# Patient Record
Sex: Female | Born: 1970 | Race: Black or African American | Hispanic: No | Marital: Married | Smoking: Never smoker
Health system: Southern US, Community
[De-identification: ages and names within clinical notes are randomized; demographics above are authoritative.]

## PROBLEM LIST (undated history)

## (undated) DIAGNOSIS — I10 Essential (primary) hypertension: Secondary | ICD-10-CM

## (undated) DIAGNOSIS — I639 Cerebral infarction, unspecified: Secondary | ICD-10-CM

## (undated) HISTORY — PX: TUBAL LIGATION: SHX77

## (undated) HISTORY — DX: Cerebral infarction, unspecified: I63.9

---

## 2014-08-08 ENCOUNTER — Emergency Department: Payer: Self-pay | Admitting: Emergency Medicine

## 2015-09-27 ENCOUNTER — Emergency Department: Payer: BC Managed Care – PPO

## 2015-09-27 ENCOUNTER — Inpatient Hospital Stay
Admission: EM | Admit: 2015-09-27 | Discharge: 2015-09-29 | DRG: 065 | Disposition: A | Discharge status: Home / Self Care | Payer: BC Managed Care – PPO | Attending: Internal Medicine | Admitting: Internal Medicine

## 2015-09-27 ENCOUNTER — Encounter: Payer: Self-pay | Admitting: *Deleted

## 2015-09-27 ENCOUNTER — Inpatient Hospital Stay: Payer: BC Managed Care – PPO

## 2015-09-27 DIAGNOSIS — D696 Thrombocytopenia, unspecified: Secondary | ICD-10-CM | POA: Diagnosis present

## 2015-09-27 DIAGNOSIS — I10 Essential (primary) hypertension: Secondary | ICD-10-CM | POA: Diagnosis present

## 2015-09-27 DIAGNOSIS — I639 Cerebral infarction, unspecified: Secondary | ICD-10-CM | POA: Diagnosis not present

## 2015-09-27 DIAGNOSIS — I160 Hypertensive urgency: Secondary | ICD-10-CM | POA: Diagnosis present

## 2015-09-27 DIAGNOSIS — G8194 Hemiplegia, unspecified affecting left nondominant side: Secondary | ICD-10-CM | POA: Diagnosis present

## 2015-09-27 DIAGNOSIS — Z8249 Family history of ischemic heart disease and other diseases of the circulatory system: Secondary | ICD-10-CM

## 2015-09-27 DIAGNOSIS — G459 Transient cerebral ischemic attack, unspecified: Secondary | ICD-10-CM | POA: Diagnosis not present

## 2015-09-27 DIAGNOSIS — E876 Hypokalemia: Secondary | ICD-10-CM | POA: Diagnosis present

## 2015-09-27 DIAGNOSIS — Z8673 Personal history of transient ischemic attack (TIA), and cerebral infarction without residual deficits: Secondary | ICD-10-CM

## 2015-09-27 DIAGNOSIS — E785 Hyperlipidemia, unspecified: Secondary | ICD-10-CM | POA: Diagnosis present

## 2015-09-27 DIAGNOSIS — Z9119 Patient's noncompliance with other medical treatment and regimen: Secondary | ICD-10-CM

## 2015-09-27 DIAGNOSIS — Z7902 Long term (current) use of antithrombotics/antiplatelets: Secondary | ICD-10-CM | POA: Diagnosis not present

## 2015-09-27 DIAGNOSIS — Z821 Family history of blindness and visual loss: Secondary | ICD-10-CM

## 2015-09-27 DIAGNOSIS — D649 Anemia, unspecified: Secondary | ICD-10-CM | POA: Diagnosis present

## 2015-09-27 DIAGNOSIS — Z823 Family history of stroke: Secondary | ICD-10-CM

## 2015-09-27 DIAGNOSIS — Z7982 Long term (current) use of aspirin: Secondary | ICD-10-CM | POA: Diagnosis not present

## 2015-09-27 DIAGNOSIS — Z818 Family history of other mental and behavioral disorders: Secondary | ICD-10-CM | POA: Diagnosis not present

## 2015-09-27 DIAGNOSIS — G451 Carotid artery syndrome (hemispheric): Secondary | ICD-10-CM

## 2015-09-27 DIAGNOSIS — I6350 Cerebral infarction due to unspecified occlusion or stenosis of unspecified cerebral artery: Secondary | ICD-10-CM

## 2015-09-27 HISTORY — DX: Essential (primary) hypertension: I10

## 2015-09-27 LAB — COMPREHENSIVE METABOLIC PANEL
ALBUMIN: 4 g/dL (ref 3.5–5.0)
ALT: 16 U/L (ref 14–54)
AST: 21 U/L (ref 15–41)
Alkaline Phosphatase: 64 U/L (ref 38–126)
Anion gap: 5 (ref 5–15)
BUN: 20 mg/dL (ref 6–20)
CHLORIDE: 106 mmol/L (ref 101–111)
CO2: 28 mmol/L (ref 22–32)
Calcium: 8.9 mg/dL (ref 8.9–10.3)
Creatinine, Ser: 0.89 mg/dL (ref 0.44–1.00)
GFR calc non Af Amer: >60 mL/min (ref >60)
Glucose, Bld: 101 mg/dL — ABNORMAL HIGH (ref 65–99)
Potassium: 3.4 mmol/L — ABNORMAL LOW (ref 3.5–5.1)
SODIUM: 139 mmol/L (ref 135–145)
Total Bilirubin: 0.5 mg/dL (ref 0.3–1.2)
Total Protein: 7.7 g/dL (ref 6.5–8.1)

## 2015-09-27 LAB — LIPID PANEL
CHOL/HDL RATIO: 3.9 ratio
CHOLESTEROL: 196 mg/dL (ref 0–200)
HDL: 50 mg/dL (ref >40)
LDL Cholesterol: 134 mg/dL — ABNORMAL HIGH (ref 0–99)
TRIGLYCERIDES: 60 mg/dL (ref <150)
VLDL: 12 mg/dL (ref 0–40)

## 2015-09-27 LAB — CBC WITH DIFFERENTIAL/PLATELET
BASOS PCT: 1 %
Basophils Absolute: 0 10*3/uL (ref 0–0.1)
EOS ABS: 0.1 10*3/uL (ref 0–0.7)
EOS PCT: 2 %
HCT: 32.9 % — ABNORMAL LOW (ref 35.0–47.0)
Hemoglobin: 10.7 g/dL — ABNORMAL LOW (ref 12.0–16.0)
Lymphocytes Relative: 35 %
Lymphs Abs: 1.6 10*3/uL (ref 1.0–3.6)
MCH: 26.9 pg (ref 26.0–34.0)
MCHC: 32.5 g/dL (ref 32.0–36.0)
MCV: 82.7 fL (ref 80.0–100.0)
Monocytes Absolute: 0.3 10*3/uL (ref 0.2–0.9)
Monocytes Relative: 7 %
Neutro Abs: 2.6 10*3/uL (ref 1.4–6.5)
Neutrophils Relative %: 55 %
Platelets: 145 10*3/uL — ABNORMAL LOW (ref 150–440)
RBC: 3.98 MIL/uL (ref 3.80–5.20)
RDW: 13.7 % (ref 11.5–14.5)
WBC: 4.7 10*3/uL (ref 3.6–11.0)

## 2015-09-27 LAB — MRSA PCR SCREENING: MRSA BY PCR: NEGATIVE

## 2015-09-27 LAB — TROPONIN I: Troponin I: <0.03 ng/mL (ref <0.031)

## 2015-09-27 LAB — GLUCOSE, CAPILLARY: Glucose-Capillary: 96 mg/dL (ref 65–99)

## 2015-09-27 MED ORDER — PANTOPRAZOLE SODIUM 40 MG PO TBEC
40.0000 mg | DELAYED_RELEASE_TABLET | Freq: Every day | ORAL | Status: DC
  Administered 2015-09-28 – 2015-09-29 (×2): 40 mg via ORAL
  Filled 2015-09-27 (×2): qty 1

## 2015-09-27 MED ORDER — ASPIRIN 325 MG PO TABS
325.0000 mg | ORAL_TABLET | Freq: Every day | ORAL | Status: DC
Start: 2015-09-27 — End: 2015-09-28
  Administered 2015-09-27 – 2015-09-28 (×2): 325 mg via ORAL
  Filled 2015-09-27 (×2): qty 1

## 2015-09-27 MED ORDER — HYDRALAZINE HCL 20 MG/ML IJ SOLN
10.0000 mg | INTRAMUSCULAR | Status: DC | PRN
  Administered 2015-09-27 – 2015-09-29 (×4): 10 mg via INTRAVENOUS
  Filled 2015-09-27 (×4): qty 1

## 2015-09-27 MED ORDER — PANTOPRAZOLE SODIUM 40 MG IV SOLR
40.0000 mg | INTRAVENOUS | Status: DC
  Administered 2015-09-27: 40 mg via INTRAVENOUS
  Filled 2015-09-27: qty 40

## 2015-09-27 MED ORDER — CLONIDINE HCL 0.1 MG PO TABS
0.2000 mg | ORAL_TABLET | Freq: Once | ORAL | Status: AC
  Administered 2015-09-27: 0.2 mg via ORAL
  Filled 2015-09-27: qty 2

## 2015-09-27 MED ORDER — HYDRALAZINE HCL 20 MG/ML IJ SOLN: 10.0000 mg | Freq: Once | INTRAMUSCULAR | Status: DC

## 2015-09-27 MED ORDER — ATORVASTATIN CALCIUM 20 MG PO TABS
20.0000 mg | ORAL_TABLET | Freq: Every day | ORAL | Status: DC
  Administered 2015-09-27: 20 mg via ORAL
  Filled 2015-09-27: qty 1

## 2015-09-27 MED ORDER — AMLODIPINE BESYLATE 10 MG PO TABS
10.0000 mg | ORAL_TABLET | Freq: Every day | ORAL | Status: DC
  Administered 2015-09-27 – 2015-09-29 (×3): 10 mg via ORAL
  Filled 2015-09-27 (×3): qty 1

## 2015-09-27 MED ORDER — METOPROLOL TARTRATE 25 MG PO TABS
25.0000 mg | ORAL_TABLET | Freq: Two times a day (BID) | ORAL | Status: DC
  Administered 2015-09-27 (×3): 25 mg via ORAL
  Filled 2015-09-27 (×3): qty 1

## 2015-09-27 MED ORDER — ASPIRIN 81 MG PO CHEW
324.0000 mg | CHEWABLE_TABLET | Freq: Once | ORAL | Status: AC
  Administered 2015-09-27: 324 mg via ORAL
  Filled 2015-09-27: qty 4

## 2015-09-27 MED ORDER — ACETAMINOPHEN 325 MG PO TABS
650.0000 mg | ORAL_TABLET | ORAL | Status: DC | PRN
  Administered 2015-09-27 (×2): 650 mg via ORAL
  Filled 2015-09-27: qty 2

## 2015-09-27 MED ORDER — CLONIDINE HCL 0.1 MG PO TABS
0.1000 mg | ORAL_TABLET | Freq: Three times a day (TID) | ORAL | Status: DC
  Administered 2015-09-27 (×3): 0.1 mg via ORAL
  Filled 2015-09-27 (×3): qty 1

## 2015-09-27 MED ORDER — STROKE: EARLY STAGES OF RECOVERY BOOK
Freq: Once | Status: AC
  Administered 2015-09-27: 10:00:00

## 2015-09-27 MED ORDER — ACETAMINOPHEN 650 MG RE SUPP: 650.0000 mg | RECTAL | Status: DC | PRN

## 2015-09-27 MED ORDER — HYDRALAZINE HCL 20 MG/ML IJ SOLN
10.0000 mg | Freq: Once | INTRAMUSCULAR | Status: AC
  Administered 2015-09-27: 10 mg via INTRAVENOUS
  Filled 2015-09-27: qty 1

## 2015-09-27 MED ORDER — ENOXAPARIN SODIUM 40 MG/0.4ML ~~LOC~~ SOLN
40.0000 mg | SUBCUTANEOUS | Status: DC
  Administered 2015-09-27 – 2015-09-28 (×2): 40 mg via SUBCUTANEOUS
  Filled 2015-09-27 (×3): qty 0.4

## 2015-09-27 MED ORDER — ASPIRIN 300 MG RE SUPP: 300.0000 mg | Freq: Every day | RECTAL | Status: DC

## 2015-09-27 MED ORDER — POTASSIUM CHLORIDE CRYS ER 20 MEQ PO TBCR
20.0000 meq | EXTENDED_RELEASE_TABLET | Freq: Once | ORAL | Status: AC
Start: 2015-09-27 — End: 2015-09-27
  Administered 2015-09-27: 20 meq via ORAL
  Filled 2015-09-27: qty 1

## 2015-09-27 NOTE — Progress Notes (Signed)
Report given to [facility] on 1C. Patient to move to room 108. Patient and family notified of move.

## 2015-09-27 NOTE — Progress Notes (Signed)
PT Hold Note  Patient Details Name: Delberta Folts MRN: [id number] DOB: Apr 10, 1971   Cancelled Treatment:     Chart reviewed and RN consulted. PT order received is for "start tomorrow" 09/28/15. Asked RN to clarify PT order with MD to see if he would like PT consult today. Will continue to monitor and if order is modified for today will attempt evaluation when appropriate.   Sharalyn Ink Huprich PT, DPT   Huprich,Jason 09/27/2015, 9:04 AM

## 2015-09-27 NOTE — ED Notes (Signed)
Notified nurse I am leaving with her pt now.

## 2015-09-27 NOTE — H&P (Signed)
[facility] [place] at [facility]   PATIENT NAME: Stephanie Hawkins    MR#:  [id number]  DATE OF BIRTH:  [birth year]  DATE OF ADMISSION:  09/27/2015  PRIMARY CARE PHYSICIAN: No primary care provider on file.   REQUESTING/REFERRING PHYSICIAN:   CHIEF COMPLAINT:   Chief Complaint  Patient presents with  . Extremity Weakness    HISTORY OF PRESENT ILLNESS: Ayleen Mckinstry  is a 45 y.o. female with a known history of hypertension presented to the emergency room with left lower extremity weakness since yesterday morning. Patient woke up yesterday morning was trying to go to the bathroom to brush her teeth. She felt weak in her left leg and also noticed some weakness in the left upper extremity. Patient says her left leg was more weaker than the left upper extremity when she first noticed it. No history of any slurred speech. No complaints of any tingling or numbness in any part of the body. Prior history of any stroke. Patient not taking blood pressure medication for the last 1 year. Does not follow up with any primary care physician for the last few years. When patient presented to the emergency room her blood pressure was high and systolic blood pressure was more than 210 mm of Hg and diastolic pressure was around 160 mm of Hg. Patient was given IV hydralazine in the emergency room for control of blood pressure. Initial CT scan of the head did not show any intracranial abnormality. No history of any headache dizziness or blurry vision. No history of any fall or any seizures.  PAST MEDICAL HISTORY:   Past Medical History  Diagnosis Date  . Hypertension     PAST SURGICAL HISTORY: Past Surgical History  Procedure Laterality Date  . Tubal ligation      SOCIAL HISTORY:  Social History  Substance Use Topics  . Smoking status: Never Smoker   . Smokeless tobacco: Not on file  . Alcohol Use: No    FAMILY HISTORY:  Family History  Problem Relation Age of Onset  . Heart  failure Mother   . CVA Father     DRUG ALLERGIES: No Known Allergies  REVIEW OF SYSTEMS:   CONSTITUTIONAL: No fever, fatigue or weakness.  EYES: No blurred or double vision.  EARS, NOSE, AND THROAT: No tinnitus or ear pain.  RESPIRATORY: No cough, shortness of breath, wheezing or hemoptysis.  CARDIOVASCULAR: No chest pain, orthopnea, edema.  GASTROINTESTINAL: No nausea, vomiting, diarrhea or abdominal pain.  GENITOURINARY: No dysuria, hematuria.  ENDOCRINE: No polyuria, nocturia,  HEMATOLOGY: No anemia, easy bruising or bleeding SKIN: No rash or lesion. MUSCULOSKELETAL: No joint pain or arthritis.   NEUROLOGIC: No tingling, numbness, weakness in the left lower extremity noted  PSYCHIATRY: No anxiety or depression.   MEDICATIONS AT HOME:  Prior to Admission medications   Not on File      PHYSICAL EXAMINATION:   VITAL SIGNS: Blood pressure 211/161, pulse 85, temperature 98.7 F (37.1 C), temperature source Oral, resp. rate 18, height  (1.651 m), weight 90.719 kg (200 lb), last menstrual period 09/05/2015, SpO2 97 %.  GENERAL:  45 y.o.-year-old patient lying in the bed with no acute distress.  EYES: Pupils equal, round, reactive to light and accommodation. No scleral icterus. Extraocular muscles intact.  HEENT: Head atraumatic, normocephalic. Oropharynx and nasopharynx clear.  NECK:  Supple, no jugular venous distention. No thyroid enlargement, no tenderness.  LUNGS: Normal breath sounds bilaterally, no wheezing, rales,rhonchi or crepitation. No use of  accessory muscles of respiration.  CARDIOVASCULAR: S1, S2 normal. No murmurs, rubs, or gallops.  ABDOMEN: Soft, nontender, nondistended. Bowel sounds present. No organomegaly or mass.  EXTREMITIES: No pedal edema, cyanosis, or clubbing.  NEUROLOGIC: Cranial nerves II through XII are intact. Muscle strength 5/5 in right upper and right lower extremity and left upper extremity..Power 4/5 left lower extremity. Sensation intact.  No cerebellar signs noted.  PSYCHIATRIC: The patient is alert and oriented x 3.  SKIN: No obvious rash, lesion, or ulcer.   LABORATORY PANEL:   CBC  Recent Labs Lab 09/27/15 0058  WBC 4.7  HGB 10.7*  HCT 32.9*  PLT 145*  MCV 82.7  MCH 26.9  MCHC 32.5  RDW 13.7  LYMPHSABS 1.6  MONOABS 0.3  EOSABS 0.1  BASOSABS 0.0   ------------------------------------------------------------------------------------------------------------------  Chemistries   Recent Labs Lab 09/27/15 0058  NA 139  K 3.4*  CL 106  CO2 28  GLUCOSE 101*  BUN 20  CREATININE 0.89  CALCIUM 8.9  AST 21  ALT 16  ALKPHOS 64  BILITOT 0.5   ------------------------------------------------------------------------------------------------------------------ estimated creatinine clearance is 89.8 mL/min (by C-G formula based on Cr of 0.89). ------------------------------------------------------------------------------------------------------------------ No results for input(s): TSH, T4TOTAL, T3FREE, THYROIDAB in the last 72 hours.  Invalid input(s): FREET3   Coagulation profile No results for input(s): INR, PROTIME in the last 168 hours. ------------------------------------------------------------------------------------------------------------------- No results for input(s): DDIMER in the last 72 hours. -------------------------------------------------------------------------------------------------------------------  Cardiac Enzymes  Recent Labs Lab 09/27/15 0058  TROPONINI <0.03   ------------------------------------------------------------------------------------------------------------------ Invalid input(s): POCBNP  ---------------------------------------------------------------------------------------------------------------  Urinalysis No results found for: COLORURINE, APPEARANCEUR, LABSPEC, PHURINE, GLUCOSEU, HGBUR, BILIRUBINUR, KETONESUR, PROTEINUR, UROBILINOGEN, NITRITE,  LEUKOCYTESUR   RADIOLOGY: Ct Head Wo Contrast  09/27/2015  CLINICAL DATA:  Heaviness in the left lower leg since the morning of 09/26/2015. No known injury. EXAM: CT HEAD WITHOUT CONTRAST TECHNIQUE: Contiguous axial images were obtained from the base of the skull through the vertex without intravenous contrast. COMPARISON:  None. FINDINGS: Examination is limited due to streak artifact. Ventricles and sulci appear symmetrical. No ventricular dilatation. There patchy low-attenuation changes in the deep white matter. These are nonspecific and could represent small vessel ischemic changes or other demyelinating process. No abnormal extra-axial fluid collections. Gray-white matter junctions are distinct. Basal cisterns are not effaced. No acute intracranial hemorrhage. Opacification of scattered ethmoid air cells. Paranasal sinuses and mastoid air cells are otherwise not opacified. Calvarium appears intact. IMPRESSION: Nonspecific changes in the white matter. No acute intracranial hemorrhage or mass effect. Electronically Signed   By: Burman Nieves M.D.   On: 09/27/2015 01:40   Dg Chest Port 1 View  09/27/2015  CLINICAL DATA:  Heaviness in the left lower leg. History of hypertension. EXAM: PORTABLE CHEST 1 VIEW COMPARISON:  None. FINDINGS: Slightly shallow inspiration. Cardiac enlargement without vascular congestion. No focal airspace disease or consolidation in the lungs. No blunting of costophrenic angles. No pneumothorax. Mediastinal contours appear intact. IMPRESSION: Mild cardiac enlargement.  No evidence of active pulmonary disease. Electronically Signed   By: Burman Nieves M.D.   On: 09/27/2015 01:24    EKG: Orders placed or performed during the hospital encounter of 09/27/15  . ED EKG  . ED EKG  . EKG 12-Lead  . EKG 12-Lead    IMPRESSION AND PLAN: 45 year old female patient with history of high blood pressure noncompliant with hypertensive medication presented to the emergency room with  elevated blood pressure and left lower extremity weakness.  Admitting diagnosis 1. Hypertensive urgency 2. Noncompliance with hypertension  medication 3. Transient ischemic attack 4. Mild hypokalemia  Treatment plan Mid patient to stepdown unit Control blood pressure with oral metoprolol and IV hydralazine as needed Her blood pressure does not get controlled will start patient on IV nicardipine drip MRI brain to rule out CVA, carotid ultrasound and echocardiogram Neurology consultation Replace potassium.  All the records are reviewed and case discussed with ED provider. Management plans discussed with the patient, family and they are in agreement.  CODE STATUS:FULL Code Status History    This patient does not have a recorded code status. Please follow your organizational policy for patients in this situation.       TOTAL TIME TAKING CARE OF THIS PATIENT: 54 minutes.    Ihor Austin M.D on 09/27/2015 at 2:22 AM  Between 7am to 6pm - Pager - [phone]  After 6pm go to [email or web address] - password [email or web address] [facility]  [place] [facility]  Office  [phone]  CC: Primary care physician; No primary care provider on file.

## 2015-09-27 NOTE — Progress Notes (Signed)
Spoke with Dr. Elisabeth Pigeon about patient's status, MRI results, and blood pressure. MD ordered patient 10 mg of norvasc once a day to start this afternoon and ordered patient to move out to the any med-surg floor without telemetry. No new orders based on MRI results.

## 2015-09-27 NOTE — ED Notes (Signed)
Patient transported to CT 

## 2015-09-27 NOTE — Progress Notes (Signed)
eLink Physician-Brief Progress Note Patient Name: Stephanie Hawkins DOB: May 29, 1971 MRN: [id number]   Date of Service  09/27/2015  HPI/Events of Note  45 yo female with PMH of HTN not compliant with medications X 1 year. Presents with BP = 210/160 and LE weakness. Head CT Scan - negative. Medical regimen includes Clonidine X 1, Hydralazine IV PRN, Lovenox [place], ASA and Lopressor. BP = 148/94, HR = 78, Sat = 99% and RR = 16. Management per Hospitalist Service.   eICU Interventions  Will order Protonix IV, otherwise continue present management.      Intervention Category Evaluation Type: New Patient Evaluation  Lenell Antu 09/27/2015, 6:07 AM

## 2015-09-27 NOTE — ED Notes (Signed)
Failed second IV site start x2, asking another nurse to try.

## 2015-09-27 NOTE — ED Provider Notes (Signed)
[facility] Emergency Department Provider Note  ____________________________________________  Time seen: Approximately 1:39 AM  I have reviewed the triage vital signs and the nursing notes.   HISTORY  Chief Complaint Extremity Weakness    HPI Stephanie Hawkins is a 45 y.o. female who presents to the ED from home with a chief complaint of left leg weakness. Symptoms began yesterday approximately 7 AM while patient was showering. She noted mild weakness to her left leg. She was able to go to work and get through the day. When she awoke prior to her arrival to use the restroom, she almost fell secondary to leg weakness. Denies headache, vision changes, slurred speech, similar symptoms previously. Denies recent travel or trauma. She does admit to not taking antihypertensives 1 year. Denies associated symptoms of neck pain, chest pain, shortness of breath, abdominal pain, nausea, vomiting, diarrhea. Nothing makes her symptoms better or worse.   Past Medical History  Diagnosis Date  . Hypertension     Patient Active Problem List   Diagnosis Date Noted  . Hypertensive urgency 09/27/2015  . TIA (transient ischemic attack) 09/27/2015    Past Surgical History  Procedure Laterality Date  . Tubal ligation      No current outpatient prescriptions on file.  Allergies Review of patient's allergies indicates no known allergies.  Family History  Problem Relation Age of Onset  . Heart failure Mother   . CVA Father     Social History Social History  Substance Use Topics  . Smoking status: Never Smoker   . Smokeless tobacco: Not on file  . Alcohol Use: No    Review of Systems Constitutional: No fever/chills Eyes: No visual changes. ENT: No sore throat. Cardiovascular: Denies chest pain. Respiratory: Denies shortness of breath. Gastrointestinal: No abdominal pain.  No nausea, no vomiting.  No diarrhea.  No constipation. Genitourinary: Negative for  dysuria. Musculoskeletal: Negative for back pain. Skin: Negative for rash. Neurological: Negative for headaches or numbness. Positive for left lower leg weakness.  10-point ROS otherwise negative.  ____________________________________________   PHYSICAL EXAM:  VITAL SIGNS: ED Triage Vitals  Enc Vitals Group     BP 09/27/15 0032 239/139 mmHg     Pulse Rate 09/27/15 0028 98     Resp 09/27/15 0028 20     Temp 09/27/15 0028 98.7 F (37.1 C)     Temp Source 09/27/15 0028 Oral     SpO2 09/27/15 0028 98 %     Weight 09/27/15 0028 200 lb (90.719 kg)     Height 09/27/15 0028  (1.651 m)     Head Cir --      Peak Flow --      Pain Score --      Pain Loc --      Pain Edu? --      Excl. in GC? --     Constitutional: Alert and oriented. Well appearing and in no acute distress. Eyes: Conjunctivae are normal. PERRL. EOMI. Head: Atraumatic. Nose: No congestion/rhinnorhea. Mouth/Throat: Mucous membranes are moist.  Oropharynx non-erythematous. Neck: No stridor.   Cardiovascular: Normal rate, regular rhythm. Grossly normal heart sounds.  Good peripheral circulation. Respiratory: Normal respiratory effort.  No retractions. Lungs CTAB. Gastrointestinal: Soft and nontender. No distention. No abdominal bruits. No CVA tenderness. Musculoskeletal: No lower extremity tenderness nor edema.  No joint effusions. Neurologic:  Normal speech and language. CN II-XII grossly intact. 5/5 motor strength and sensation are lateral upper extremities. 4/5 motor strength left lower extremity compared  to right. Skin:  Skin is warm, dry and intact. No rash noted. Psychiatric: Mood and affect are normal. Speech and behavior are normal.  ____________________________________________   LABS (all labs ordered are listed, but only abnormal results are displayed)  Labs Reviewed  CBC WITH DIFFERENTIAL/PLATELET - Abnormal; Notable for the following:    Hemoglobin 10.7 (*)    HCT 32.9 (*)    Platelets 145 (*)     All other components within normal limits  COMPREHENSIVE METABOLIC PANEL - Abnormal; Notable for the following:    Potassium 3.4 (*)    Glucose, Bld 101 (*)    All other components within normal limits  TROPONIN I   ____________________________________________  EKG  ED ECG REPORT I, SUNG,JADE J, the attending physician, personally viewed and interpreted this ECG.   Date: 09/27/2015  EKG Time: 0048  Rate: 82  Rhythm: normal EKG, normal sinus rhythm  Axis: Normal  Intervals:none  ST&T Change: T-wave inversion lateral leads  ____________________________________________  RADIOLOGY  CT head interpreted per Dr. Andria Meuse: Nonspecific changes in the white matter. No acute intracranial hemorrhage or mass effect.  Portable chest x-ray (viewed by me, interpreted per Dr. Andria Meuse): Nonspecific changes in the white matter. No acute intracranial hemorrhage or mass effect. ____________________________________________   PROCEDURES  Procedure(s) performed: None  Critical Care performed: No  ____________________________________________   INITIAL IMPRESSION / ASSESSMENT AND PLAN / ED COURSE  Pertinent labs & imaging results that were available during my care of the patient were reviewed by me and considered in my medical decision making (see chart for details).  45 year old female who presents with focal left lower extremity weakness associated with elevated blood pressure. Code stroke was not initiated secondary to prolonged length of patient's symptoms. Laboratory results and imaging studies unremarkable. Patient was given aspirin and IV hydralazine to gently lower her blood pressure. Case was discussed with hospitalist team for admission. ____________________________________________   FINAL CLINICAL IMPRESSION(S) / ED DIAGNOSES  Final diagnoses:  TIA (transient ischemic attack)  Hypertensive urgency  Cerebral infarction due to unspecified mechanism      Irean Hong,  MD 09/27/15 [phone]

## 2015-09-27 NOTE — Progress Notes (Signed)
Speech Therapy Note: reviewed chart notes; consulted NSG and met w/ pt/family. Pt denied any speech-language deficits; she conversed on the phone and w/ SLP/family appropriately w/ no apparent language deficits. Pt also denied any swallowing deficits; noted she had eaten some of her burger and drank a drink during our conversation w/out deficits. Pt appears at her baseline w/ regard to ST services. ST will sign off at this time w/ NSG to reconsult if nec. Pt agreed. MD updated.

## 2015-09-27 NOTE — Progress Notes (Signed)
PT Cancellation Note  Patient Details Name: Stephanie Hawkins MRN: [id number] DOB: September 07, 1970   Cancelled Treatment:    Reason Eval/Treat Not Completed: Medical issues which prohibited therapy. Chart reviewed and RN consulted. BP is elevated 215/123 at last recorded reading. Pt is contraindicated for PT currently. Will attempt evaluation on later date as pt is medically appropraite.  Stephanie Hawkins PT, DPT   Jaryah Aracena 09/27/2015, 4:23 PM

## 2015-09-27 NOTE — ED Notes (Signed)
Pt returned from CT °

## 2015-09-27 NOTE — Progress Notes (Signed)
PT Cancellation Note  Patient Details Name: Stephanie Hawkins MRN: [id number] DOB: 1971/03/19   Cancelled Treatment:    Reason Eval/Treat Not Completed: Patient at procedure or test/unavailable. New order received to initiate PT evaluation today. Attempted to evaluate pt but she is out of room for imaging at this time. Will attempt at later time/date as pt is available. RN notified.  Sharalyn Ink Hillis Mcphatter PT, DPT   Vila Dory 09/27/2015, 1:48 PM

## 2015-09-27 NOTE — ED Notes (Cosign Needed)
XR at bedside

## 2015-09-27 NOTE — Progress Notes (Signed)
RN heard loud crying from patient's room. Went to check on patient and found Consulting civil engineer and patient's significant other comforting patient. Charge RN stated that patient was just informed that her mother has passed away. Family declined a visit from the [facility]. Sports administrator provided emotional support.

## 2015-09-27 NOTE — ED Notes (Addendum)
pt ambulatory to triage.  Pt reports heaviness in left lower leg.  Sx began yesterday.  No known injury.  No swelling noted.  Blood pressure elevated.  No meds for htn for 1 year.  No headache.  Pt alert.  Speech clear.

## 2015-09-27 NOTE — Progress Notes (Signed)
RN spoke with MD, Dr. Elisabeth Pigeon, on the phone about patient's blood pressure even after prn hydralazine dose, NPO status, general status, and plan for several procedures with morning. MD to adjust blood pressure medications, to put in diet order, and gave order for patient to be able to go to procedures without RN. MD stated he would be up to assess patient in person shortly.

## 2015-09-27 NOTE — Plan of Care (Signed)
Problem: Coping: Goal: Ability to identify strategies to decrease anxiety will improve Outcome: Progressing Patient with family at bedside providing support.  Problem: Tissue Perfusion: Goal: Cerebral tissue perfusion will improve Outcome: Progressing Patient with WDL neurological status but blood pressure still elevated. RN gave prn medications and RN and MD collaborated several times during shift to adjust blood pressure medications.

## 2015-09-27 NOTE — Progress Notes (Signed)
[facility] [place] [facility]   PATIENT NAME: Nykole Matos    MR#:  [id number]  DATE OF BIRTH:  1971-06-15  SUBJECTIVE:  CHIEF COMPLAINT:   Chief Complaint  Patient presents with  . Extremity Weakness    Had BP severely high, found to have stroke today.   Bp again went up to 200.  Pt have no new complains.  REVIEW OF SYSTEMS:  CONSTITUTIONAL: No fever, fatigue or weakness.  EYES: No blurred or double vision.  EARS, NOSE, AND THROAT: No tinnitus or ear pain.  RESPIRATORY: No cough, shortness of breath, wheezing or hemoptysis.  CARDIOVASCULAR: No chest pain, orthopnea, edema.  GASTROINTESTINAL: No nausea, vomiting, diarrhea or abdominal pain.  GENITOURINARY: No dysuria, hematuria.  ENDOCRINE: No polyuria, nocturia,  HEMATOLOGY: No anemia, easy bruising or bleeding SKIN: No rash or lesion. MUSCULOSKELETAL: No joint pain or arthritis.   NEUROLOGIC: No tingling, numbness,left leg weakness.  PSYCHIATRY: No anxiety or depression.   ROS  DRUG ALLERGIES:  No Known Allergies  VITALS:  Blood pressure 148/87, pulse 78, temperature 97.5 F (36.4 C), temperature source Oral, resp. rate 20, height  (1.676 m), weight 93.7 kg (206 lb 9.1 oz), last menstrual period 09/05/2015, SpO2 100 %.  PHYSICAL EXAMINATION:  GENERAL:  45 y.o.-year-old patient lying in the bed with no acute distress.  EYES: Pupils equal, round, reactive to light and accommodation. No scleral icterus. Extraocular muscles intact.  HEENT: Head atraumatic, normocephalic. Oropharynx and nasopharynx clear.  NECK:  Supple, no jugular venous distention. No thyroid enlargement, no tenderness.  LUNGS: Normal breath sounds bilaterally, no wheezing, rales,rhonchi or crepitation. No use of accessory muscles of respiration.  CARDIOVASCULAR: S1, S2 normal. No murmurs, rubs, or gallops.  ABDOMEN: Soft, nontender, nondistended. Bowel sounds present. No organomegaly or mass.  EXTREMITIES: No pedal  edema, cyanosis, or clubbing.  NEUROLOGIC: Cranial nerves II through XII are intact. Muscle strength 5/5 in all extremities except in LL on feet 4/5.Marland Kitchen Sensation intact. Gait not checked.  PSYCHIATRIC: The patient is alert and oriented x 3.  SKIN: No obvious rash, lesion, or ulcer.   Physical Exam LABORATORY PANEL:   CBC  Recent Labs Lab 09/27/15 0058  WBC 4.7  HGB 10.7*  HCT 32.9*  PLT 145*   ------------------------------------------------------------------------------------------------------------------  Chemistries   Recent Labs Lab 09/27/15 0058  NA 139  K 3.4*  CL 106  CO2 28  GLUCOSE 101*  BUN 20  CREATININE 0.89  CALCIUM 8.9  AST 21  ALT 16  ALKPHOS 64  BILITOT 0.5   ------------------------------------------------------------------------------------------------------------------  Cardiac Enzymes  Recent Labs Lab 09/27/15 0058  TROPONINI <0.03   ------------------------------------------------------------------------------------------------------------------  RADIOLOGY:  Ct Head Wo Contrast  09/27/2015  CLINICAL DATA:  Heaviness in the left lower leg since the morning of 09/26/2015. No known injury. EXAM: CT HEAD WITHOUT CONTRAST TECHNIQUE: Contiguous axial images were obtained from the base of the skull through the vertex without intravenous contrast. COMPARISON:  None. FINDINGS: Examination is limited due to streak artifact. Ventricles and sulci appear symmetrical. No ventricular dilatation. There patchy low-attenuation changes in the deep white matter. These are nonspecific and could represent small vessel ischemic changes or other demyelinating process. No abnormal extra-axial fluid collections. Gray-white matter junctions are distinct. Basal cisterns are not effaced. No acute intracranial hemorrhage. Opacification of scattered ethmoid air cells. Paranasal sinuses and mastoid air cells are otherwise not opacified. Calvarium appears intact. IMPRESSION:  Nonspecific changes in the white matter. No acute intracranial hemorrhage or mass  effect. Electronically Signed   By: Burman Nieves M.D.   On: 09/27/2015 01:40   Mr Brain Wo Contrast  09/27/2015  CLINICAL DATA:  45 year old female with new onset left side weakness yesterday morning. No associated headache or speech changes. Initial encounter. EXAM: MRI HEAD WITHOUT CONTRAST MRA HEAD WITHOUT CONTRAST TECHNIQUE: Multiplanar, multiecho pulse sequences of the brain and surrounding structures were obtained without intravenous contrast. Angiographic images of the head were obtained using MRA technique without contrast. COMPARISON:  Hip CT without contrast 0120 hours today. FINDINGS: MRI HEAD FINDINGS Curvilinear restricted diffusion tracking from the posterior right corona radiata through to the posterior right putamen (series 5, image 26, series 8, images 19 and 20). Associated T2 and FLAIR hyperintensity. No associated hemorrhage or mass effect. No other restricted diffusion. Major intracranial vascular flow voids are within normal limits. Outside of the acute findings there is scattered bilateral periventricular and central white matter T2 and FLAIR hyperintensity (series 9, image 18). No chronic cerebral blood products. No cortical encephalomalacia. 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 Aside from this in the acute findings the other deep gray matter nuclei are within normal limits. Brainstem and cerebellum within normal limits. Partially empty sella. No midline shift, mass effect, evidence of mass lesion, ventriculomegaly, extra-axial collection or acute intracranial hemorrhage. Cervicomedullary junction within normal limits. Negative visualized cervical spine. Visible internal auditory structures appear normal. Mastoids are clear. Mild to moderate paranasal sinus mucosal thickening. Orbit and scalp soft tissues are within normal limits. Visualized bone marrow signal is within normal limits.  MRA HEAD FINDINGS Antegrade flow in the posterior circulation. Codominant distal vertebral arteries with no stenosis. Normal vertebrobasilar junction. Normal AICA origins. Tortuous basilar artery without stenosis. SCA and PCA origins are normal. Left posterior communicating artery is present, the right is diminutive or absent. Proximal PCA branches appear normal, but there is mild to moderate distal PCA irregularity, most apparent in the left P3 segments. Antegrade flow in both ICA siphons, but severe irregularity at both carotid termini worse on the right. High-grade stenosis in the distal supra clinoid ICAs with evidence of right ICA terminus small collaterals (series 20, image 9). Despite this, both MCA origins remain patent. The left ACA origin is patent, the right A1 segment is diminutive or occluded. The anterior communicating artery is patent. Beyond their origins both MCA M1 segments are mildly irregular. Both MCA bifurcations are patent. No MCA branch occlusion. No definite M2 or distal MCA branch irregularity. IMPRESSION: 1. Small acute lacunar type infarct tracking from the right posterior corona radiata to the right putamen. No hemorrhage or mass effect. 2. 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 3. 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 Additionally, there is bilateral distal PCA branch irregularity. 4. Top differential considerations include sequelae of CNS vasculitis, hypercoagulable states (antiphospholipid syndromes, sickle cell disease), connective tissue disorder (SLE), less likely age advanced atherosclerosis or thromboembolic disease (consider paradoxical emboli such as from PFO). Electronically Signed   By: Odessa Fleming M.D.   On:  09/27/2015 14:55   US Carotid Bilateral  09/27/2015  CLINICAL DATA:  TIA. EXAM: BILATERAL CAROTID DUPLEX ULTRASOUND TECHNIQUE: Wallace Cullens scale imaging, color Doppler and duplex ultrasound were performed of bilateral carotid and vertebral arteries in the neck. COMPARISON:  MRI 09/27/2015 FINDINGS: Criteria: Quantification  of carotid stenosis is based on velocity parameters that correlate the residual internal carotid diameter with NASCET-based stenosis levels, using the diameter of the distal internal carotid lumen as the denominator for stenosis measurement. The following velocity measurements were obtained: RIGHT ICA:  78/42 cm/sec CCA:  91/19 cm/sec SYSTOLIC ICA/CCA RATIO:  0.9 DIASTOLIC ICA/CCA RATIO:  2.2 ECA:  91 cm/sec LEFT ICA:  88/31 cm/sec CCA:  108/31 cm/sec SYSTOLIC ICA/CCA RATIO:  0.8 DIASTOLIC ICA/CCA RATIO:  1.0 ECA:  83 cm/sec RIGHT CAROTID ARTERY: Mild right carotid bifurcation plaque. No flow limiting stenosis. RIGHT VERTEBRAL ARTERY:  Patent with antegrade flow. LEFT CAROTID ARTERY:  Mild left carotid bifurcation plaque. LEFT VERTEBRAL ARTERY:  Patent with antegrade flow. Incidental note is made of a 3.5 x 3.0 x 2.2 cm complex right thyroid lobe cystic mass. Dedicated thyroid ultrasound suggested for further evaluation . IMPRESSION: 1. Mild bilateral carotid bifurcation atherosclerotic plaque. No flow limiting stenosis. Degree of stenosis less than 50%. Vertebral arteries are patent antegrade flow . 2. Incidental note is made of a 3.5 x 3.0 x 2.2 cm complex right thyroid lobe cystic mass. Dedicated thyroid ultrasound suggest for further evaluation. Electronically Signed   By: Maisie Fus  Register   On: 09/27/2015 15:36   Dg Chest Port 1 View  09/27/2015  CLINICAL DATA:  Heaviness in the left lower leg. History of hypertension. EXAM: PORTABLE CHEST 1 VIEW COMPARISON:  None. FINDINGS: Slightly shallow inspiration. Cardiac enlargement without vascular congestion. No focal airspace disease or  consolidation in the lungs. No blunting of costophrenic angles. No pneumothorax. Mediastinal contours appear intact. IMPRESSION: Mild cardiac enlargement.  No evidence of active pulmonary disease. Electronically Signed   By: Burman Nieves M.D.   On: 09/27/2015 01:24   Mr Maxine Glenn Head/brain Wo Cm  09/27/2015  CLINICAL DATA:  45 year old female with new onset left side weakness yesterday morning. No associated headache or speech changes. Initial encounter. EXAM: MRI HEAD WITHOUT CONTRAST MRA HEAD WITHOUT CONTRAST TECHNIQUE: Multiplanar, multiecho pulse sequences of the brain and surrounding structures were obtained without intravenous contrast. Angiographic images of the head were obtained using MRA technique without contrast. COMPARISON:  Hip CT without contrast 0120 hours today. FINDINGS: MRI HEAD FINDINGS Curvilinear restricted diffusion tracking from the posterior right corona radiata through to the posterior right putamen (series 5, image 26, series 8, images 19 and 20). Associated T2 and FLAIR hyperintensity. No associated hemorrhage or mass effect. No other restricted diffusion. Major intracranial vascular flow voids are within normal limits. Outside of the acute findings there is scattered bilateral periventricular and central white matter T2 and FLAIR hyperintensity (series 9, image 18). No chronic cerebral blood products. No cortical encephalomalacia. 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 Aside from this in the acute findings the other deep gray matter nuclei are within normal limits. Brainstem and cerebellum within normal limits. Partially empty sella. No midline shift, mass effect, evidence of mass lesion, ventriculomegaly, extra-axial collection or acute intracranial hemorrhage. Cervicomedullary junction within normal limits. Negative visualized cervical spine. Visible internal auditory structures appear normal. Mastoids are clear. Mild to moderate paranasal sinus mucosal thickening.  Orbit and scalp soft tissues are within normal limits. Visualized bone marrow signal is within normal limits. MRA HEAD FINDINGS Antegrade flow in the posterior circulation. Codominant distal vertebral arteries with no stenosis. Normal vertebrobasilar junction. Normal AICA origins. Tortuous basilar artery without stenosis. SCA and PCA origins are normal. Left posterior communicating artery is present, the right is diminutive or absent. Proximal  PCA branches appear normal, but there is mild to moderate distal PCA irregularity, most apparent in the left P3 segments. Antegrade flow in both ICA siphons, but severe irregularity at both carotid termini worse on the right. High-grade stenosis in the distal supra clinoid ICAs with evidence of right ICA terminus small collaterals (series 20, image 9). Despite this, both MCA origins remain patent. The left ACA origin is patent, the right A1 segment is diminutive or occluded. The anterior communicating artery is patent. Beyond their origins both MCA M1 segments are mildly irregular. Both MCA bifurcations are patent. No MCA branch occlusion. No definite M2 or distal MCA branch irregularity. IMPRESSION: 1. Small acute lacunar type infarct tracking from the right posterior corona radiata to the right putamen. No hemorrhage or mass effect. 2. 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 3. 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 Additionally, there is bilateral distal PCA branch irregularity. 4. Top differential considerations include sequelae of CNS vasculitis, hypercoagulable states (antiphospholipid syndromes, sickle cell disease), connective tissue disorder (SLE), less likely age advanced atherosclerosis or  thromboembolic disease (consider paradoxical emboli such as from PFO). Electronically Signed   By: Odessa Fleming M.D.   On: 09/27/2015 14:55    ASSESSMENT AND PLAN:   Principal Problem:   Hypertensive urgency Active Problems:   TIA (transient ischemic attack)   * CVA   Confirmed by MRI   ASA, Statin, PT eval.   Echo , carotid doppler.    MRA showes stenosis and possible vasculitis.  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   I spoke to Dr. Kernordle(rheumatologist) and Dr. Wyn Quaker( vascular surgeon)   They will come and see pt tomorrow.    Currently no ICH,. As per them- likely pt may need to be transferred.    I spoke to pt, her husband and her sister ( who is a Engineer, civil (consulting)) - in her room and informed about this fionding and answered the questions.  * Uncontroled HTn   IV hydralazine, metoprolol, Clonidine, added amlodipin.   Due to acute stroke- will allow SBP to stay between 160-190.  * Hyperlipidmeia   Statin.   All the records are reviewed and case discussed with Care Management/Social Workerr. Management plans discussed with the patient, family and they are in agreement.  CODE STATUS: full  TOTAL TIME TAKING CARE OF THIS PATIENT: 90 minutes.  Discussed the case with consultants, nurse, and pt and her family in detail.   POSSIBLE D/C IN 1-2 DAYS, DEPENDING ON CLINICAL CONDITION.   Altamese Dilling M.D on 09/27/2015   Between 7am to 6pm - Pager - [phone]  After 6pm go to [email or web address] - password [email or web address] [facility]  [place] [facility]  Office  [phone]  CC: Primary care physician; No primary care provider on file.  Note: This dictation was prepared with Dragon dictation along with smaller phrase technology. Any transcriptional errors that result from this process are unintentional.

## 2015-09-27 NOTE — ED Notes (Signed)
Discussed IV options with patient, AC placement preferred. 

## 2015-09-28 ENCOUNTER — Inpatient Hospital Stay: Payer: BC Managed Care – PPO

## 2015-09-28 ENCOUNTER — Encounter: Payer: Self-pay | Admitting: Radiology

## 2015-09-28 ENCOUNTER — Inpatient Hospital Stay
Admit: 2015-09-28 | Discharge: 2015-09-28 | Disposition: A | Discharge status: Home / Self Care | Payer: BC Managed Care – PPO | Attending: Internal Medicine | Admitting: Internal Medicine

## 2015-09-28 DIAGNOSIS — I160 Hypertensive urgency: Secondary | ICD-10-CM

## 2015-09-28 DIAGNOSIS — I639 Cerebral infarction, unspecified: Principal | ICD-10-CM

## 2015-09-28 LAB — ANTITHROMBIN III: ANTITHROMB III FUNC: 90 % (ref 75–120)

## 2015-09-28 LAB — C-REACTIVE PROTEIN: CRP: 0.6 mg/dL (ref <1.0)

## 2015-09-28 LAB — SEDIMENTATION RATE: SED RATE: 39 mm/h — AB (ref 0–20)

## 2015-09-28 MED ORDER — CLOPIDOGREL BISULFATE 75 MG PO TABS
75.0000 mg | ORAL_TABLET | Freq: Every day | ORAL | Status: DC
  Administered 2015-09-28 – 2015-09-29 (×2): 75 mg via ORAL
  Filled 2015-09-28 (×2): qty 1

## 2015-09-28 MED ORDER — CLONIDINE HCL 0.1 MG PO TABS
0.2000 mg | ORAL_TABLET | Freq: Three times a day (TID) | ORAL | Status: DC
  Administered 2015-09-28 (×2): 0.2 mg via ORAL
  Filled 2015-09-28 (×2): qty 2

## 2015-09-28 MED ORDER — CLONIDINE HCL 0.1 MG PO TABS
0.1000 mg | ORAL_TABLET | Freq: Three times a day (TID) | ORAL | Status: DC
  Administered 2015-09-28 – 2015-09-29 (×3): 0.1 mg via ORAL
  Filled 2015-09-28 (×3): qty 1

## 2015-09-28 MED ORDER — METOPROLOL TARTRATE 50 MG PO TABS
50.0000 mg | ORAL_TABLET | Freq: Two times a day (BID) | ORAL | Status: DC
  Administered 2015-09-28 – 2015-09-29 (×3): 50 mg via ORAL
  Filled 2015-09-28 (×3): qty 1

## 2015-09-28 MED ORDER — ASPIRIN EC 81 MG PO TBEC
81.0000 mg | DELAYED_RELEASE_TABLET | Freq: Every day | ORAL | Status: DC
  Administered 2015-09-29: 11:00:00 81 mg via ORAL
  Filled 2015-09-28: qty 1

## 2015-09-28 MED ORDER — IOHEXOL 350 MG/ML SOLN
100.0000 mL | Freq: Once | INTRAVENOUS | Status: AC | PRN
Start: 2015-09-28 — End: 2015-09-28
  Administered 2015-09-28: 100 mL via INTRAVENOUS

## 2015-09-28 MED ORDER — ATORVASTATIN CALCIUM 20 MG PO TABS
40.0000 mg | ORAL_TABLET | Freq: Every day | ORAL | Status: DC
  Administered 2015-09-28: 40 mg via ORAL
  Filled 2015-09-28: qty 2

## 2015-09-28 NOTE — Progress Notes (Signed)
[facility] - [place] at [facility]   PATIENT NAME: Stephanie Hawkins    MR#:  [id number]  DATE OF BIRTH:  1971-07-01  SUBJECTIVE:  CHIEF COMPLAINT:   Chief Complaint  Patient presents with  . Extremity Weakness    Had BP severely high, found to have stroke .   Have weakness in left leg and numbness on left arm.   No new complains.   REVIEW OF SYSTEMS:  CONSTITUTIONAL: No fever, fatigue or weakness.  EYES: No blurred or double vision.  EARS, NOSE, AND THROAT: No tinnitus or ear pain.  RESPIRATORY: No cough, shortness of breath, wheezing or hemoptysis.  CARDIOVASCULAR: No chest pain, orthopnea, edema.  GASTROINTESTINAL: No nausea, vomiting, diarrhea or abdominal pain.  GENITOURINARY: No dysuria, hematuria.  ENDOCRINE: No polyuria, nocturia,  HEMATOLOGY: No anemia, easy bruising or bleeding SKIN: No rash or lesion. MUSCULOSKELETAL: No joint pain or arthritis.   NEUROLOGIC: No tingling, numbness,left leg weakness.  PSYCHIATRY: No anxiety or depression.   ROS  DRUG ALLERGIES:  No Known Allergies  VITALS:  Blood pressure 123/67, pulse 61, temperature 97.7 F (36.5 C), temperature source Oral, resp. rate 19, height 5\' 7"  (1.702 m), weight 89.903 kg (198 lb 3.2 oz), last menstrual period 09/05/2015, SpO2 99 %.  PHYSICAL EXAMINATION:  GENERAL:  45 y.o.-year-old patient lying in the bed with no acute distress.  EYES: Pupils equal, round, reactive to light and accommodation. No scleral icterus. Extraocular muscles intact.  HEENT: Head atraumatic, normocephalic. Oropharynx and nasopharynx clear.  NECK:  Supple, no jugular venous distention. No thyroid enlargement, no tenderness.  LUNGS: Normal breath sounds bilaterally, no wheezing, rales,rhonchi or crepitation. No use of accessory muscles of respiration.  CARDIOVASCULAR: S1, S2 normal. No murmurs, rubs, or gallops.  ABDOMEN: Soft, nontender, nondistended. Bowel sounds present. No organomegaly or mass.   EXTREMITIES: No pedal edema, cyanosis, or clubbing.  NEUROLOGIC: Cranial nerves II through XII are intact. Muscle strength 5/5 in all extremities except in LL on feet 4/5.Marland Kitchen Sensation intact. Gait not checked.  PSYCHIATRIC: The patient is alert and oriented x 3.  SKIN: No obvious rash, lesion, or ulcer.   Physical Exam LABORATORY PANEL:   CBC  Recent Labs Lab 09/27/15 0058  WBC 4.7  HGB 10.7*  HCT 32.9*  PLT 145*   ------------------------------------------------------------------------------------------------------------------  Chemistries   Recent Labs Lab 09/27/15 0058  NA 139  K 3.4*  CL 106  CO2 28  GLUCOSE 101*  BUN 20  CREATININE 0.89  CALCIUM 8.9  AST 21  ALT 16  ALKPHOS 64  BILITOT 0.5   ------------------------------------------------------------------------------------------------------------------  Cardiac Enzymes  Recent Labs Lab 09/27/15 0058  TROPONINI <0.03   ------------------------------------------------------------------------------------------------------------------  RADIOLOGY:  Ct Head Wo Contrast  09/27/2015  CLINICAL DATA:  Heaviness in the left lower leg since the morning of 09/26/2015. No known injury. EXAM: CT HEAD WITHOUT CONTRAST TECHNIQUE: Contiguous axial images were obtained from the base of the skull through the vertex without intravenous contrast. COMPARISON:  None. FINDINGS: Examination is limited due to streak artifact. Ventricles and sulci appear symmetrical. No ventricular dilatation. There patchy low-attenuation changes in the deep white matter. These are nonspecific and could represent small vessel ischemic changes or other demyelinating process. No abnormal extra-axial fluid collections. Gray-white matter junctions are distinct. Basal cisterns are not effaced. No acute intracranial hemorrhage. Opacification of scattered ethmoid air cells. Paranasal sinuses and mastoid air cells are otherwise not opacified. Calvarium appears  intact. IMPRESSION: Nonspecific changes in the white matter. No acute  intracranial hemorrhage or mass effect. Electronically Signed   By: Burman Nieves M.D.   On: 09/27/2015 01:40   Ct Angio Neck W/cm &/or Wo/cm  09/28/2015  CLINICAL DATA:  Follow-up examination for stroke, left-sided weakness, abnormal intracranial MRA. EXAM: CT ANGIOGRAPHY NECK TECHNIQUE: Multidetector CT imaging of the neck was performed using the standard protocol during bolus administration of intravenous contrast. Multiplanar CT image reconstructions and MIPs were obtained to evaluate the vascular anatomy. Carotid stenosis measurements (when applicable) are obtained utilizing NASCET criteria, using the distal internal carotid diameter as the denominator. CONTRAST:  OMNIPAQUE IOHEXOL 350 MG/ML SOLN COMPARISON:  None. FINDINGS: Aortic arch: The visualized aortic arch is of normal caliber and appearance without significant atheromatous disease. No intraluminal thrombus or other abnormality. Note is made of a bovine arch with common origin of the right brachiocephalic and left common carotid artery. The visualized subclavian arteries are well opacified without stenosis or other abnormality. No significant irregularity seen about the visualized subclavian arteries. Right carotid system: Origin of the right common carotid artery widely patent. The proximal right common carotid artery is tortuous. Distally, there is circumferential wall thickening with mild intimal irregularity about the distal right common carotid artery (series 6, image 22). Wall thickening extends to the right carotid bifurcation without associated stenosis. Intimal irregularity with mild wall thickening about the proximal right ICA as well (series 6, image 76). There is a somewhat abrupt caliber change within the proximal ICA just distally without high-grade stenosis. Distally, the right ICA is mildly tortuous but widely patent to the circle of Willis. There is  question of minimal intimal irregularity within the mid and distal right ICA, although this is certainly less apparent as compared to its proximal aspect. No dissection flap or flow limiting stenosis. These changes appear to largely spare the right external carotid artery and its branches. Left carotid system: Left common carotid artery widely patent at its origin. The left common carotid artery is tortuous proximally. Similar to the distal right common carotid artery, there is subtle intimal irregularity with circumferential wall thickening about the distal left common carotid artery (series 6, image 88). No flow limiting stenosis. Changes extend into the left carotid bifurcation and proximal left ICA. Similar to the right, there is somewhat abrupt narrowing of the proximal left ICA with mild tortuosity. Mild wall thickening with intimal irregularity seen within the proximal left ICA as well. Left ICA is widely patent to the circle of Willis without flow-limiting stenosis. Similar to the right side, these changes appear to largely spare the left external carotid artery and its branches. Vertebral arteries:Both vertebral arteries arise from the subclavian arteries. The right vertebral artery is slightly dominant. Vertebral arteries are widely patent to the skullbase without flow-limiting stenosis, dissection, or occlusion. No definite irregularity seen about the vertebral arteries as seen a in the carotid arteries system in the neck. However, the intracranial V4 segments demonstrate subtle multifocal irregularity. In addition, the partially visualized vertebrobasilar junction and proximal basilar artery are irregular in appearance with multi focal narrowing, similar did changes seen on previous MRA (series 6, image 6). Skeleton: No acute osseous abnormality. No worrisome lytic or blastic osseous lesions. Mild degenerative spondylolysis present at C5-6. Other neck: 7 mm nodule present in the right upper lobe (series  6, image 205). Visualized lungs are otherwise clear without acute process. Visualized superior mediastinum within normal limits. Heterogeneous nodule measuring up to 2.9 cm present within the right thyroid lobe. Few additional scattered subcentimeter nodules noted  within the thyroid gland bilaterally. No pathologically enlarged lymph nodes identified within the neck. No acute soft tissue abnormality. IMPRESSION: 1. Mild intimal irregularity with subtle circumferential soft tissue thickening about the distal common and proximal internal carotid arteries bilaterally. The appearance of these vessels is felt to be abnormal. Primary differential considerations include underlying vasculitis, connective tissue disorder, or possibly age advanced atheromatous disease, although this is felt to be less likely. These changes do not have the typical appearance for FMD. 2. Relative sparing of the vertebral arteries in the neck, which are widely patent to the skullbase. However, the intracranial V4 segments demonstrate subtle irregularity, with more obvious irregularity and multifocal narrowing at the partially visualized vertebrobasilar junction and proximal basilar artery, similar to the changes seen on previous MRA. 3. Heterogeneous right thyroid nodule measuring up to 2.9 cm. Correlation with dedicated thyroid ultrasound recommended if not already performed. 4. 7 mm right upper lobe nodule, indeterminate. If the patient is at high risk for bronchogenic carcinoma, follow-up chest CT at 3-48months is recommended. If the patient is at low risk for bronchogenic carcinoma, follow-up chest CT at 6-12 months is recommended. This recommendation follows the consensus statement: Guidelines for Management of Small Pulmonary Nodules Detected on CT Scans: A Statement from the Fleischner Society as published in Radiology 2005; 237:395-400. Electronically Signed   By: Rise Mu M.D.   On: 09/28/2015 21:01   Mr Brain Wo  Contrast  09/27/2015  CLINICAL DATA:  45 year old female with new onset left side weakness yesterday morning. No associated headache or speech changes. Initial encounter. EXAM: MRI HEAD WITHOUT CONTRAST MRA HEAD WITHOUT CONTRAST TECHNIQUE: Multiplanar, multiecho pulse sequences of the brain and surrounding structures were obtained without intravenous contrast. Angiographic images of the head were obtained using MRA technique without contrast. COMPARISON:  Hip CT without contrast 0120 hours today. FINDINGS: MRI HEAD FINDINGS Curvilinear restricted diffusion tracking from the posterior right corona radiata through to the posterior right putamen (series 5, image 26, series 8, images 19 and 20). Associated T2 and FLAIR hyperintensity. No associated hemorrhage or mass effect. No other restricted diffusion. Major intracranial vascular flow voids are within normal limits. Outside of the acute findings there is scattered bilateral periventricular and central white matter T2 and FLAIR hyperintensity (series 9, image 18). No chronic cerebral blood products. No cortical encephalomalacia. 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 Aside from this in the acute findings the other deep gray matter nuclei are within normal limits. Brainstem and cerebellum within normal limits. Partially empty sella. No midline shift, mass effect, evidence of mass lesion, ventriculomegaly, extra-axial collection or acute intracranial hemorrhage. Cervicomedullary junction within normal limits. Negative visualized cervical spine. Visible internal auditory structures appear normal. Mastoids are clear. Mild to moderate paranasal sinus mucosal thickening. Orbit and scalp soft tissues are within normal limits. Visualized bone marrow signal is within normal limits. MRA HEAD FINDINGS Antegrade flow in the posterior circulation. Codominant distal vertebral arteries with no stenosis. Normal vertebrobasilar junction. Normal AICA origins. Tortuous  basilar artery without stenosis. SCA and PCA origins are normal. Left posterior communicating artery is present, the right is diminutive or absent. Proximal PCA branches appear normal, but there is mild to moderate distal PCA irregularity, most apparent in the left P3 segments. Antegrade flow in both ICA siphons, but severe irregularity at both carotid termini worse on the right. High-grade stenosis in the distal supra clinoid ICAs with evidence of right ICA terminus small collaterals (series 20, image 9). Despite this,  both MCA origins remain patent. The left ACA origin is patent, the right A1 segment is diminutive or occluded. The anterior communicating artery is patent. Beyond their origins both MCA M1 segments are mildly irregular. Both MCA bifurcations are patent. No MCA branch occlusion. No definite M2 or distal MCA branch irregularity. IMPRESSION: 1. Small acute lacunar type infarct tracking from the right posterior corona radiata to the right putamen. No hemorrhage or mass effect. 2. 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 3. 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 Additionally, there is bilateral distal PCA branch irregularity. 4. Top differential considerations include sequelae of CNS vasculitis, hypercoagulable states (antiphospholipid syndromes, sickle cell disease), connective tissue disorder (SLE), less likely age advanced atherosclerosis or thromboembolic disease (consider paradoxical emboli such as from PFO). Electronically Signed   By: Odessa Fleming M.D.   On: 09/27/2015 14:55   US Carotid Bilateral  09/27/2015  CLINICAL DATA:  TIA. EXAM: BILATERAL CAROTID DUPLEX ULTRASOUND TECHNIQUE: Wallace Cullens scale imaging, color Doppler and duplex ultrasound  were performed of bilateral carotid and vertebral arteries in the neck. COMPARISON:  MRI 09/27/2015 FINDINGS: Criteria: Quantification of carotid stenosis is based on velocity parameters that correlate the residual internal carotid diameter with NASCET-based stenosis levels, using the diameter of the distal internal carotid lumen as the denominator for stenosis measurement. The following velocity measurements were obtained: RIGHT ICA:  78/42 cm/sec CCA:  91/19 cm/sec SYSTOLIC ICA/CCA RATIO:  0.9 DIASTOLIC ICA/CCA RATIO:  2.2 ECA:  91 cm/sec LEFT ICA:  88/31 cm/sec CCA:  108/31 cm/sec SYSTOLIC ICA/CCA RATIO:  0.8 DIASTOLIC ICA/CCA RATIO:  1.0 ECA:  83 cm/sec RIGHT CAROTID ARTERY: Mild right carotid bifurcation plaque. No flow limiting stenosis. RIGHT VERTEBRAL ARTERY:  Patent with antegrade flow. LEFT CAROTID ARTERY:  Mild left carotid bifurcation plaque. LEFT VERTEBRAL ARTERY:  Patent with antegrade flow. Incidental note is made of a 3.5 x 3.0 x 2.2 cm complex right thyroid lobe cystic mass. Dedicated thyroid ultrasound suggested for further evaluation . IMPRESSION: 1. Mild bilateral carotid bifurcation atherosclerotic plaque. No flow limiting stenosis. Degree of stenosis less than 50%. Vertebral arteries are patent antegrade flow . 2. Incidental note is made of a 3.5 x 3.0 x 2.2 cm complex right thyroid lobe cystic mass. Dedicated thyroid ultrasound suggest for further evaluation. Electronically Signed   By: Maisie Fus  Register   On: 09/27/2015 15:36   Dg Chest Port 1 View  09/27/2015  CLINICAL DATA:  Heaviness in the left lower leg. History of hypertension. EXAM: PORTABLE CHEST 1 VIEW COMPARISON:  None. FINDINGS: Slightly shallow inspiration. Cardiac enlargement without vascular congestion. No focal airspace disease or consolidation in the lungs. No blunting of costophrenic angles. No pneumothorax. Mediastinal contours appear intact. IMPRESSION: Mild cardiac enlargement.  No evidence of active pulmonary disease.  Electronically Signed   By: Burman Nieves M.D.   On: 09/27/2015 01:24   Mr Maxine Glenn Head/brain Wo Cm  09/27/2015  CLINICAL DATA:  45 year old female with new onset left side weakness yesterday morning. No associated headache or speech changes. Initial encounter. EXAM: MRI HEAD WITHOUT CONTRAST MRA HEAD WITHOUT CONTRAST TECHNIQUE: Multiplanar, multiecho pulse sequences of the brain and surrounding structures were obtained without intravenous contrast. Angiographic images of the head were obtained using MRA technique without contrast. COMPARISON:  Hip CT without contrast 0120 hours today.  FINDINGS: MRI HEAD FINDINGS Curvilinear restricted diffusion tracking from the posterior right corona radiata through to the posterior right putamen (series 5, image 26, series 8, images 19 and 20). Associated T2 and FLAIR hyperintensity. No associated hemorrhage or mass effect. No other restricted diffusion. Major intracranial vascular flow voids are within normal limits. Outside of the acute findings there is scattered bilateral periventricular and central white matter T2 and FLAIR hyperintensity (series 9, image 18). No chronic cerebral blood products. No cortical encephalomalacia. 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 Aside from this in the acute findings the other deep gray matter nuclei are within normal limits. Brainstem and cerebellum within normal limits. Partially empty sella. No midline shift, mass effect, evidence of mass lesion, ventriculomegaly, extra-axial collection or acute intracranial hemorrhage. Cervicomedullary junction within normal limits. Negative visualized cervical spine. Visible internal auditory structures appear normal. Mastoids are clear. Mild to moderate paranasal sinus mucosal thickening. Orbit and scalp soft tissues are within normal limits. Visualized bone marrow signal is within normal limits. MRA HEAD FINDINGS Antegrade flow in the posterior circulation. Codominant distal  vertebral arteries with no stenosis. Normal vertebrobasilar junction. Normal AICA origins. Tortuous basilar artery without stenosis. SCA and PCA origins are normal. Left posterior communicating artery is present, the right is diminutive or absent. Proximal PCA branches appear normal, but there is mild to moderate distal PCA irregularity, most apparent in the left P3 segments. Antegrade flow in both ICA siphons, but severe irregularity at both carotid termini worse on the right. High-grade stenosis in the distal supra clinoid ICAs with evidence of right ICA terminus small collaterals (series 20, image 9). Despite this, both MCA origins remain patent. The left ACA origin is patent, the right A1 segment is diminutive or occluded. The anterior communicating artery is patent. Beyond their origins both MCA M1 segments are mildly irregular. Both MCA bifurcations are patent. No MCA branch occlusion. No definite M2 or distal MCA branch irregularity. IMPRESSION: 1. Small acute lacunar type infarct tracking from the right posterior corona radiata to the right putamen. No hemorrhage or mass effect. 2. 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 3. 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 Additionally, there is bilateral distal PCA branch irregularity. 4. Top differential considerations include sequelae of CNS vasculitis, hypercoagulable states (antiphospholipid syndromes, sickle cell disease), connective tissue disorder (SLE), less likely age advanced atherosclerosis or thromboembolic disease (consider paradoxical emboli such as from PFO). Electronically Signed   By: Odessa Fleming M.D.   On: 09/27/2015 14:55    ASSESSMENT AND PLAN:   Principal Problem:   Hypertensive  urgency Active Problems:   TIA (transient ischemic attack)   * CVA   Confirmed by MRI   ASA, Statin, PT eval.   Echo , carotid doppler.    MRA showes stenosis and possible vasculitis.    Consulted neurology, vascular surgery and rheumatology.   Ordered TEE, added plavix. Ordered CT angiogram neck.   Neurology also ordered hypercoagulability work up.  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   Ordered hypercoagulability work ups.   CT angio done, TEE for tomorrow.  * Uncontroled HTn   IV hydralazine, metoprolol, Clonidine, added amlodipin.   After 24 hrs of permissive htn, increased dose of meds today.  * Hyperlipidmeia   Statin.  All the records are reviewed and case discussed with Care Management/Social Workerr. Management plans discussed with the patient, family and they are in agreement.  CODE STATUS: full  TOTAL TIME TAKING CARE OF THIS PATIENT: 40 minutes.  Discussed the case with consultants, nurse, and pt and her family in detail.   POSSIBLE D/C IN 1-2 DAYS, DEPENDING ON CLINICAL CONDITION.   Altamese Dilling M.D on 09/28/2015   Between 7am to 6pm - Pager - [phone]  After 6pm go to [email or web address] - password [email or web address] [facility]  [facility]  Office  [phone]  CC: Primary care physician; No primary care provider on file.  Note: This dictation was prepared with Dragon dictation along with smaller phrase technology. Any transcriptional errors that result from this process are unintentional.

## 2015-09-28 NOTE — Progress Notes (Signed)
*  PRELIMINARY RESULTS* Echocardiogram 2D Echocardiogram has been performed.  Stephanie Hawkins Hege 09/28/2015, 1:24 PM

## 2015-09-28 NOTE — Consult Note (Addendum)
Referring Physician: Anselm Jungling    Chief Complaint: Left sided weakness  HPI: Stephanie Hawkins is an 45 y.o. female who reports waking up normal on 09/26/2015.  While brushing her teeth noted that she was having some trouble with her left side.  This resolved shortly and she was able to go to work but felt that her left foot was a little heavy.  Returned home form work and went to bed.  Awakened later to go to the bathroom and was unable to use the left sided effectively.  Patient presented for evaluation at that time.    Date last known well: 09/26/2015 Time last known well: Time: 07:00 tPA Given: No: Outside time window  Past Medical History  Diagnosis Date  . Hypertension     Past Surgical History  Procedure Laterality Date  . Tubal ligation      Family History  Problem Relation Age of Onset  . Heart failure Mother   . CVA Father    Social History:  reports that she has never smoked. She does not have any smokeless tobacco history on file. She reports that she does not drink alcohol or use illicit drugs.  Allergies: No Known Allergies  Medications:  I have reviewed the patient's current medications. Prior to Admission:  No prescriptions prior to admission   Scheduled: . amLODipine  10 mg Oral Daily  . aspirin  325 mg Oral Daily  . atorvastatin  20 mg Oral q1800  . cloNIDine  0.2 mg Oral TID  . enoxaparin (LOVENOX) injection  40 mg Subcutaneous Q24H  . metoprolol tartrate  50 mg Oral BID  . pantoprazole  40 mg Oral Daily    ROS: History obtained from the patient  General ROS: negative for - chills, fatigue, fever, night sweats, weight gain or weight loss Psychological ROS: negative for - behavioral disorder, hallucinations, memory difficulties, mood swings or suicidal ideation Ophthalmic ROS: negative for - blurry vision, double vision, eye pain or loss of vision ENT ROS: negative for - epistaxis, nasal discharge, oral lesions, sore throat, tinnitus or vertigo Allergy and  Immunology ROS: negative for - hives or itchy/watery eyes Hematological and Lymphatic ROS: negative for - bleeding problems, bruising or swollen lymph nodes Endocrine ROS: negative for - galactorrhea, hair pattern changes, polydipsia/polyuria or temperature intolerance Respiratory ROS: negative for - cough, hemoptysis, shortness of breath or wheezing Cardiovascular ROS: negative for - chest pain, dyspnea on exertion, edema or irregular heartbeat Gastrointestinal ROS: negative for - abdominal pain, diarrhea, hematemesis, nausea/vomiting or stool incontinence Genito-Urinary ROS: negative for - dysuria, hematuria, incontinence or urinary frequency/urgency Musculoskeletal ROS: negative for - joint swelling or muscular weakness Neurological ROS: as noted in HPI, occipital headache Dermatological ROS: negative for rash and skin lesion changes  Physical Examination: Blood pressure 136/81, pulse 91, temperature 98.5 F (36.9 C), temperature source Oral, resp. rate 18, height 5' 7"  (1.702 m), weight 89.903 kg (198 lb 3.2 oz), last menstrual period 09/05/2015, SpO2 100 %.  HEENT-  Normocephalic, no lesions, without obvious abnormality.  Normal external eye and conjunctiva.  Normal TM's bilaterally.  Normal auditory canals and external ears. Normal external nose, mucus membranes and septum.  Normal pharynx. Cardiovascular- S1, S2 normal, pulses palpable throughout   Lungs- chest clear, no wheezing, rales, normal symmetric air entry Abdomen- soft, non-tender; bowel sounds normal; no masses,  no organomegaly Extremities- no edema Lymph-no adenopathy palpable Musculoskeletal-no joint tenderness, deformity or swelling Skin-warm and dry, no hyperpigmentation, vitiligo, or suspicious lesions  Neurological Examination  Mental Status: Alert, oriented, thought content appropriate.  Speech fluent without evidence of aphasia.  Able to follow 3 step commands without difficulty. Cranial Nerves: II: Discs flat  bilaterally; Visual fields grossly normal, pupils equal, round, reactive to light and accommodation III,IV, VI: ptosis not present, extra-ocular motions intact bilaterally V,VII: smile symmetric, facial light touch sensation decreased on the left VIII: hearing normal bilaterally IX,X: gag reflex present XI: bilateral shoulder shrug XII: midline tongue extension Motor: Right : Upper extremity   5/5    Left:     Upper extremity   3/5  Lower extremity   5/5     Lower extremity   3/5 Tone and bulk:normal tone throughout; no atrophy noted Sensory: Pinprick and light touch decreased on the left Deep Tendon Reflexes: Slightly increased on the left Plantars: Right: downgoing   Left: downgoing Cerebellar: Normal finger-to-nose and normal heel-to-shin testing on the right.  Unable to perform on the left due to weakness Gait: not tested due to safety concerns   Laboratory Studies:  Basic Metabolic Panel:  Recent Labs Lab 09/27/15 0058  NA 139  K 3.4*  CL 106  CO2 28  GLUCOSE 101*  BUN 20  CREATININE 0.89  CALCIUM 8.9    Liver Function Tests:  Recent Labs Lab 09/27/15 0058  AST 21  ALT 16  ALKPHOS 64  BILITOT 0.5  PROT 7.7  ALBUMIN 4.0   No results for input(s): LIPASE, AMYLASE in the last 168 hours. No results for input(s): AMMONIA in the last 168 hours.  CBC:  Recent Labs Lab 09/27/15 0058  WBC 4.7  NEUTROABS 2.6  HGB 10.7*  HCT 32.9*  MCV 82.7  PLT 145*    Cardiac Enzymes:  Recent Labs Lab 09/27/15 0058  TROPONINI <0.03    BNP: Invalid input(s): POCBNP  CBG:  Recent Labs Lab 09/27/15 0517  GLUCAP 59    Microbiology: Results for orders placed or performed during the hospital encounter of 09/27/15  MRSA PCR Screening     Status: None   Collection Time: 09/27/15  5:12 AM  Result Value Ref Range Status   MRSA by PCR NEGATIVE NEGATIVE Final    Comment:        The GeneXpert MRSA Assay (FDA approved for NASAL specimens only), is one  component of a comprehensive MRSA colonization surveillance program. It is not intended to diagnose MRSA infection nor to guide or monitor treatment for MRSA infections.     Coagulation Studies: No results for input(s): LABPROT, INR in the last 72 hours.  Urinalysis: No results for input(s): COLORURINE, LABSPEC, PHURINE, GLUCOSEU, HGBUR, BILIRUBINUR, KETONESUR, PROTEINUR, UROBILINOGEN, NITRITE, LEUKOCYTESUR in the last 168 hours.  Invalid input(s): APPERANCEUR  Lipid Panel:    Component Value Date/Time   CHOL 196 09/27/2015 0058   TRIG 60 09/27/2015 0058   HDL 50 09/27/2015 0058   CHOLHDL 3.9 09/27/2015 0058   VLDL 12 09/27/2015 0058   LDLCALC 134* 09/27/2015 0058    HgbA1C: No results found for: HGBA1C  Urine Drug Screen:  No results found for: LABOPIA, COCAINSCRNUR, LABBENZ, AMPHETMU, THCU, LABBARB  Alcohol Level: No results for input(s): ETH in the last 168 hours.  Other results: EKG: sinus rhythm at 82 bpm.  Imaging: Ct Head Wo Contrast  09/27/2015  CLINICAL DATA:  Heaviness in the left lower leg since the morning of 09/26/2015. No known injury. EXAM: CT HEAD WITHOUT CONTRAST TECHNIQUE: Contiguous axial images were obtained from the base of the skull through the vertex without  intravenous contrast. COMPARISON:  None. FINDINGS: Examination is limited due to streak artifact. Ventricles and sulci appear symmetrical. No ventricular dilatation. There patchy low-attenuation changes in the deep white matter. These are nonspecific and could represent small vessel ischemic changes or other demyelinating process. No abnormal extra-axial fluid collections. Gray-white matter junctions are distinct. Basal cisterns are not effaced. No acute intracranial hemorrhage. Opacification of scattered ethmoid air cells. Paranasal sinuses and mastoid air cells are otherwise not opacified. Calvarium appears intact. IMPRESSION: Nonspecific changes in the white matter. No acute intracranial hemorrhage  or mass effect. Electronically Signed   By: Lucienne Capers M.D.   On: 09/27/2015 01:40   Mr Brain Wo Contrast  09/27/2015  CLINICAL DATA:  45 year old female with new onset left side weakness yesterday morning. No associated headache or speech changes. Initial encounter. EXAM: MRI HEAD WITHOUT CONTRAST MRA HEAD WITHOUT CONTRAST TECHNIQUE: Multiplanar, multiecho pulse sequences of the brain and surrounding structures were obtained without intravenous contrast. Angiographic images of the head were obtained using MRA technique without contrast. COMPARISON:  Hip CT without contrast 0120 hours today. FINDINGS: MRI HEAD FINDINGS Curvilinear restricted diffusion tracking from the posterior right corona radiata through to the posterior right putamen (series 5, image 26, series 8, images 19 and 20). Associated T2 and FLAIR hyperintensity. No associated hemorrhage or mass effect. No other restricted diffusion. Major intracranial vascular flow voids are within normal limits. Outside of the acute findings there is scattered bilateral periventricular and central white matter T2 and FLAIR hyperintensity (series 9, image 18). No chronic cerebral blood products. No cortical encephalomalacia. 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 Aside from this in the acute findings the other deep gray matter nuclei are within normal limits. Brainstem and cerebellum within normal limits. Partially empty sella. No midline shift, mass effect, evidence of mass lesion, ventriculomegaly, extra-axial collection or acute intracranial hemorrhage. Cervicomedullary junction within normal limits. Negative visualized cervical spine. Visible internal auditory structures appear normal. Mastoids are clear. Mild to moderate paranasal sinus mucosal thickening. Orbit and scalp soft tissues are within normal limits. Visualized bone marrow signal is within normal limits. MRA HEAD FINDINGS Antegrade flow in the posterior circulation. Codominant  distal vertebral arteries with no stenosis. Normal vertebrobasilar junction. Normal AICA origins. Tortuous basilar artery without stenosis. SCA and PCA origins are normal. Left posterior communicating artery is present, the right is diminutive or absent. Proximal PCA branches appear normal, but there is mild to moderate distal PCA irregularity, most apparent in the left P3 segments. Antegrade flow in both ICA siphons, but severe irregularity at both carotid termini worse on the right. High-grade stenosis in the distal supra clinoid ICAs with evidence of right ICA terminus small collaterals (series 20, image 9). Despite this, both MCA origins remain patent. The left ACA origin is patent, the right A1 segment is diminutive or occluded. The anterior communicating artery is patent. Beyond their origins both MCA M1 segments are mildly irregular. Both MCA bifurcations are patent. No MCA branch occlusion. No definite M2 or distal MCA branch irregularity. IMPRESSION: 1. Small acute lacunar type infarct tracking from the right posterior corona radiata to the right putamen. No hemorrhage or mass effect. 2. 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 3. 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 Additionally, there is bilateral distal PCA  branch irregularity. 4. Top differential considerations include sequelae of CNS vasculitis, hypercoagulable states (antiphospholipid syndromes, sickle cell disease), connective tissue disorder (SLE), less likely age advanced atherosclerosis or thromboembolic disease (consider paradoxical emboli such as from PFO). Electronically Signed   By: Genevie Ann M.D.   On: 09/27/2015 14:55   US Carotid Bilateral  09/27/2015  CLINICAL DATA:  TIA.  EXAM: BILATERAL CAROTID DUPLEX ULTRASOUND TECHNIQUE: Pearline Cables scale imaging, color Doppler and duplex ultrasound were performed of bilateral carotid and vertebral arteries in the neck. COMPARISON:  MRI 09/27/2015 FINDINGS: Criteria: Quantification of carotid stenosis is based on velocity parameters that correlate the residual internal carotid diameter with NASCET-based stenosis levels, using the diameter of the distal internal carotid lumen as the denominator for stenosis measurement. The following velocity measurements were obtained: RIGHT ICA:  78/42 cm/sec CCA:  07/62 cm/sec SYSTOLIC ICA/CCA RATIO:  0.9 DIASTOLIC ICA/CCA RATIO:  2.2 ECA:  91 cm/sec LEFT ICA:  88/31 cm/sec CCA:  263/33 cm/sec SYSTOLIC ICA/CCA RATIO:  0.8 DIASTOLIC ICA/CCA RATIO:  1.0 ECA:  83 cm/sec RIGHT CAROTID ARTERY: Mild right carotid bifurcation plaque. No flow limiting stenosis. RIGHT VERTEBRAL ARTERY:  Patent with antegrade flow. LEFT CAROTID ARTERY:  Mild left carotid bifurcation plaque. LEFT VERTEBRAL ARTERY:  Patent with antegrade flow. Incidental note is made of a 3.5 x 3.0 x 2.2 cm complex right thyroid lobe cystic mass. Dedicated thyroid ultrasound suggested for further evaluation . IMPRESSION: 1. Mild bilateral carotid bifurcation atherosclerotic plaque. No flow limiting stenosis. Degree of stenosis less than 50%. Vertebral arteries are patent antegrade flow . 2. Incidental note is made of a 3.5 x 3.0 x 2.2 cm complex right thyroid lobe cystic mass. Dedicated thyroid ultrasound suggest for further evaluation. Electronically Signed   By: Marcello Moores  Register   On: 09/27/2015 15:36   Dg Chest Port 1 View  09/27/2015  CLINICAL DATA:  Heaviness in the left lower leg. History of hypertension. EXAM: PORTABLE CHEST 1 VIEW COMPARISON:  None. FINDINGS: Slightly shallow inspiration. Cardiac enlargement without vascular congestion. No focal airspace disease or consolidation in the lungs. No blunting of costophrenic angles. No pneumothorax.  Mediastinal contours appear intact. IMPRESSION: Mild cardiac enlargement.  No evidence of active pulmonary disease. Electronically Signed   By: Lucienne Capers M.D.   On: 09/27/2015 01:24   Mr Jodene Nam Head/brain Wo Cm  09/27/2015  CLINICAL DATA:  45 year old female with new onset left side weakness yesterday morning. No associated headache or speech changes. Initial encounter. EXAM: MRI HEAD WITHOUT CONTRAST MRA HEAD WITHOUT CONTRAST TECHNIQUE: Multiplanar, multiecho pulse sequences of the brain and surrounding structures were obtained without intravenous contrast. Angiographic images of the head were obtained using MRA technique without contrast. COMPARISON:  Hip CT without contrast 0120 hours today. FINDINGS: MRI HEAD FINDINGS Curvilinear restricted diffusion tracking from the posterior right corona radiata through to the posterior right putamen (series 5, image 26, series 8, images 19 and 20). Associated T2 and FLAIR hyperintensity. No associated hemorrhage or mass effect. No other restricted diffusion. Major intracranial vascular flow voids are within normal limits. Outside of the acute findings there is scattered bilateral periventricular and central white matter T2 and FLAIR hyperintensity (series 9, image 18). No chronic cerebral blood products. No cortical encephalomalacia. 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 Aside from this in the acute findings the other deep gray matter nuclei are within normal limits. Brainstem and cerebellum within normal limits. Partially empty sella. No midline shift, mass effect, evidence of mass lesion,  ventriculomegaly, extra-axial collection or acute intracranial hemorrhage. Cervicomedullary junction within normal limits. Negative visualized cervical spine. Visible internal auditory structures appear normal. Mastoids are clear. Mild to moderate paranasal sinus mucosal thickening. Orbit and scalp soft tissues are within normal limits. Visualized bone marrow  signal is within normal limits. MRA HEAD FINDINGS Antegrade flow in the posterior circulation. Codominant distal vertebral arteries with no stenosis. Normal vertebrobasilar junction. Normal AICA origins. Tortuous basilar artery without stenosis. SCA and PCA origins are normal. Left posterior communicating artery is present, the right is diminutive or absent. Proximal PCA branches appear normal, but there is mild to moderate distal PCA irregularity, most apparent in the left P3 segments. Antegrade flow in both ICA siphons, but severe irregularity at both carotid termini worse on the right. High-grade stenosis in the distal supra clinoid ICAs with evidence of right ICA terminus small collaterals (series 20, image 9). Despite this, both MCA origins remain patent. The left ACA origin is patent, the right A1 segment is diminutive or occluded. The anterior communicating artery is patent. Beyond their origins both MCA M1 segments are mildly irregular. Both MCA bifurcations are patent. No MCA branch occlusion. No definite M2 or distal MCA branch irregularity. IMPRESSION: 1. Small acute lacunar type infarct tracking from the right posterior corona radiata to the right putamen. No hemorrhage or mass effect. 2. 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 3. 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 Additionally, there is bilateral distal PCA branch irregularity. 4. Top differential considerations include sequelae of CNS vasculitis, hypercoagulable states (antiphospholipid syndromes, sickle cell disease), connective tissue disorder (SLE), less likely age advanced atherosclerosis or thromboembolic disease (consider paradoxical emboli such as from PFO). Electronically  Signed   By: Genevie Ann M.D.   On: 09/27/2015 14:55    Assessment: 45 y.o. female presenting with complaints of left sided weakness.  Neurological examination reveals a left hemiparesis.  Patient with a history of hypertension.  BP markedly elevated on presentation.  MRI personally reviewed and shows a right acute corona radiata/putamenal infarct.  MRA shows high grade distal supraclinoid ICA stenosis and irregular carotid termini.  Carotid dopplers showed no hemodynamically significant stenosis.  LDL 134.  Concern for stroke at such a young age despite poorly controlled risk factors.  Further work up recommended.    Stroke Risk Factors - hyperlipidemia and hypertension  Plan: 1. HgbA1c, ANA, ESR, CRP, C3, C4, Factor V, anticardiolipin antibody, Protein S, protein C, ATIII, lupus anticoagulant, homocysteine 2. Patient will likely require a catheter angiogram.  Will speak with vascular radiologist to determine appropriate timing.   3. PT consult, OT consult, Speech consult 4. Echocardiogram pending 5. Prophylactic therapy-Antiplatelet med: Aspirin - dose 369m daily 6. NPO until RN stroke swallow screen 7. Telemetry monitoring 8. Frequent neuro checks 9. Agree with statin initiation 10. Agree with BP control   LAlexis Goodell MD Neurology [phone]/25/2017, 10:49 AM   Addendum: Case discussed with Dr DEstanislado Pandy Patient to be started on Plavix as well as ASA.  Preliminary on echocardiogram is normal.  Will schedule for TEE tomorrow.  At discharge to follow up with Dr. DEstanislado Pandyfor catheter angiogram.    LAlexis Goodell MD Neurology [phone]

## 2015-09-28 NOTE — Evaluation (Signed)
Physical Therapy Evaluation Patient Details Name: Stephanie Hawkins MRN: [id number] DOB: Jun 20, 1971 Today's Date: 09/28/2015   History of Present Illness  Stephanie Hawkins is a 45 y.o. female with a known history of hypertension presented to the emergency room with left lower extremity weakness since yesterday morning. Patient woke up yesterday morning was trying to go to the bathroom to brush her teeth. She felt weak in her left leg and also noticed some weakness in the left upper extremity. Patient says her left leg was weaker than the left upper extremity when she first noticed it. No history of any slurred speech. No complaints of any tingling or numbness in any part of the body. Prior history of any stroke. Patient not taking blood pressure medication for the last 1 year. Does not follow up with any primary care physician for the last few years. When patient presented to the emergency room her blood pressure was high and systolic blood pressure was more than 210 mm of Hg and diastolic pressure was around 160 mm of Hg. Patient was given IV hydralazine in the emergency room for control of blood pressure. Initial CT scan of the head did not show any intracranial abnormality. No history of any headache dizziness or blurry vision. No history of any fall or any seizures. Attempted to evaluate yesterday but she was out of room for testing as well as with elevated BP throughout the day. Pt reports full independence with community mobility prior to arrival. She drives and works. Brain MRI showed small acute lacunar CVA R posterior corona radiata to R putamen as well as chronic L CVA. Of note pt was advised that her mother passed away yesterday 2015/10/23.   Clinical Impression  Pt is a pleasant 45 yo AA female who was admitted for acute R sided CVA with LUE/LLE deficits. L sided deficits  include MMT: L shoulder 4-/5, L elbow strength is 4+/5. Grip strength appears very mildly decreased. Finger opposition strength and  coordination is decreased. Positive pronator drift LUE. Decreased finger to nose and rapid alternating movements on LUE. No spasticity or clonus in LUE. Decreased light touch sensation LUE C6-T2 dermatomes. Reflexes deferred. 4-/5 L hip flexion, 4/5 L knee flexion, 4+/5 L knee extension, <3/5 L ankle DF however able to dorsiflex against gravity beyond neutral and able to take resistance. No clonus or spasticity noted. Deferred reflex testing. LLE decreased light touch sensation S1-S2. Pt ambulates with steppage gait and intermittent L foot drop as well as L knee buckling. Decreased R step length and intermittent vaulting off RLE. L trendelenberg. Pt with infrequent need fro minA+1 due to instability with gait. Able to ambulate safety with rolling walker but unsafe with attempts without assistive device. Pt will need OP PT and OP OT for LLE/LUE deficits respectively in order to work toward goal of returning to prior level of function and returning to work. Pt will benefit from skilled PT services to address deficits in strength, balance, and mobility in order to return to full function at home.     Follow Up Recommendations Outpatient PT;Supervision - Intermittent    Equipment Recommendations  Rolling walker with 5" wheels    Recommendations for Other Services       Precautions / Restrictions Precautions Precautions: None Restrictions Weight Bearing Restrictions: No      Mobility  Bed Mobility Overal bed mobility: Independent             General bed mobility comments: Good speed and sequencing noted with HOB  elevated and use of bed rails  Transfers Overall transfer level: Needs assistance Equipment used: Rolling walker (2 wheeled) Transfers: Sit to/from Stand Sit to Stand: Min guard         General transfer comment: Pt demonstrates decreased weight shifting to LLE and decreased LLE strength. Overall she is stable with transfers with safe hand placement however notable decrease in  balance from baseline  Ambulation/Gait Ambulation/Gait assistance: Min guard;Min assist Ambulation Distance (Feet): 120 Feet Assistive device: Rolling walker (2 wheeled) Gait Pattern/deviations: Steppage;Decreased dorsiflexion - left;Decreased stance time - left Gait velocity: Decreased Gait velocity interpretation: <1.8 ft/sec, indicative of risk for recurrent falls General Gait Details: Pt demonstrates L steppage gait with mild to moderate foot drag occasionally catching L foot on ground during ambulation. L knee buckling noted during ambulation with decreased terminal knee extension prior to initial contact. Decreased L push off and mild vaulting off R side noted during gait. L trendelenberg noted during ambulation. Pt requires mostly min guard but intermittent minA for infrequent catching of L foot and for turns. Attempted short distance ambulation without assistive device but pt too unsteady with LLE weakness to ambulate without rolling walker at this time.  Stairs            Wheelchair Mobility    Modified Rankin (Stroke Patients Only)       Balance Overall balance assessment: Needs assistance Sitting-balance support: No upper extremity supported Sitting balance-Leahy Scale: Normal     Standing balance support: No upper extremity supported Standing balance-Leahy Scale: Fair Standing balance comment: Positive Rhomberg after approximately 10 seconds. Able to maintain WBOS static standing balance. Evidence for instability during gait requiring assistive device to correct. No further formalized balance testing performed                             Pertinent Vitals/Pain Pain Assessment: No/denies pain    Home Living Family/patient expects to be discharged to:: Private residence Living Arrangements: Spouse/significant other Available Help at Discharge: Family Type of Home: House Home Access: Stairs to enter Entrance Stairs-Rails: Left Entrance Stairs-Number  of Steps: 3 (2+1) Home Layout: Multi-level;Able to live on main level with bedroom/bathroom Home Equipment: None      Prior Function Level of Independence: Independent         Comments: Drives and works. Full community ambulator without assistive device     Hand Dominance   Dominant Hand: Right    Extremity/Trunk Assessment   Upper Extremity Assessment: LUE deficits/detail       LUE Deficits / Details: L shoulder 4-/5, L elbow strength is 4+/5. Grip strength appears very mildly decreased. Finger opposition strength and coordination is decreased. Positive pronator drift LUE. Decreased finger to nose and rapid alternating movements on LUE. No spasticity or clonus in LUE. Decreased light touch sensation LUE C6-T2 dermatomes. RUE grossly 4+ to 5/5 throughout without notable deficits   Lower Extremity Assessment: LLE deficits/detail   LLE Deficits / Details: 4-/5 L hip flexion, 4/5 L knee flexion, 4+/5 L knee extension, <3/5 L ankle DF however able to dorsiflex against gravity beyond neutral and able to take resistance. No clonus or spasticity noted. Deferred reflex testing. LLE decreased light touch sensation S1-S2. RLE grossly 4+ to 5/5 throughout.     Communication   Communication: No difficulties  Cognition Arousal/Alertness: Awake/alert Behavior During Therapy: WFL for tasks assessed/performed Overall Cognitive Status: Within Functional Limits for tasks assessed  General Comments      Exercises        Assessment/Plan    PT Assessment Patient needs continued PT services  PT Diagnosis Difficulty walking;Abnormality of gait;Generalized weakness;Hemiplegia non-dominant side   PT Problem List Decreased strength;Decreased range of motion;Decreased balance;Decreased mobility;Decreased knowledge of use of DME;Decreased safety awareness;Impaired sensation  PT Treatment Interventions DME instruction;Gait training;Stair training;Functional  mobility training;Therapeutic activities;Therapeutic exercise;Balance training;Neuromuscular re-education;Patient/family education   PT Goals (Current goals can be found in the Care Plan section) Acute Rehab PT Goals Patient Stated Goal: Return to prior level of function and return to work PT Goal Formulation: With patient/family Time For Goal Achievement: 10/12/15 Potential to Achieve Goals: Good    Frequency 7X/week   Barriers to discharge        Co-evaluation               End of Session Equipment Utilized During Treatment: Gait belt Activity Tolerance: Patient tolerated treatment well Patient left: in bed;with call bell/phone within reach;with family/visitor present (low fall risk in computer, advised to call RN for bathroom) Nurse Communication: Mobility status;Precautions         Time: 1610-9604 PT Time Calculation (min) (ACUTE ONLY): 30 min   Charges:   PT Evaluation $PT Eval Moderate Complexity: 1 Procedure PT Treatments $Gait Training: 8-22 mins   PT G Codes:       Sharalyn Ink Whitley Strycharz PT, DPT   Znya Albino 09/28/2015, 10:24 AM

## 2015-09-28 NOTE — Evaluation (Signed)
Occupational Therapy Evaluation Patient Details Name: Stephanie Hawkins MRN: [id number] DOB: 1971-03-14 Today's Date: 09/28/2015    History of Present Illness This patient is a 45 year old female with a known history of hypertension who came to [facility] ED with left sided weakness. Patient woke up yesterday morning was trying to go to the bathroom to brush her teeth. She felt weak in her left leg and also noticed some weakness in the left upper extremity. Patient says her left leg was weaker than the left upper extremity when she first noticed it. No history of any slurred speech. No complaints of any tingling or numbness in any part of the body. Prior history of any stroke. Patient not taking blood pressure medication for the last 1 year. Does not follow up with any primary care physician for the last few years. When patient presented to the emergency room her blood pressure was high and systolic blood pressure was more than 210 mm of Hg and diastolic pressure was around 160 mm of Hg. Patient was given IV hydralazine in the emergency room for control of blood pressure. Initial CT scan of the head did not show any intracranial abnormality. No history of any headache dizziness or blurry vision. No history of any fall or any seizures. Attempted to evaluate yesterday but she was out of room for testing as well as with elevated BP throughout the day. Pt reports full independence with community mobility prior to arrival. She drives and works. Brain MRI showed small acute lacunar CVA R posterior corona radiata to R putamen as well as chronic L CVA. Of note pt was advised that her mother passed away yesterday 10/12/15. MRI results are 1. Small acute lacunar type infarct tracking from the right posterior corona radiata to the right putamen. No hemorrhage or mass effect. 2. Underlying chronic lacunar infarct of the left putamen, and mildly to moderately advanced cerebral white matter signal  changes which may indicate additional small vessel disease. 3. 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 Additionally, there is bilateral distal PCA branch irregularity. 4. Top differential considerations include sequelae of CNS vasculitis, hypercoagulable states (antiphospholipid syndromes, sickle cell disease), connective tissue disorder (SLE), less likely age advanced atherosclerosis or thromboembolic disease (consider paradoxical emboli such as from PFO).   Clinical Impression   This patient is a 45 year old female who came to [facility] with the above history. She lives with her husband in a multilevel home (able to live on 1st floor). She had been independent with activities of daily living and functional mobility and drives and works. She now shows deficits with sensation, coordination and mobility. She would benefit from Occupational Therapy for ADL/functional mobility training and L upper extremity restoration.     Follow Up Recommendations  Outpatient OT/PT    Equipment Recommendations       Recommendations for Other Services       Precautions / Restrictions Precautions Precautions: None Restrictions Weight Bearing Restrictions: No      Mobility Bed Mobility  Transfers     Balance                             ADL  General ADL Comments: Had been independent driving and working.  She now needs assist. She will need minimal assist with dressing but will be dependent for tying and fasteners.      Vision     Perception     Praxis      Pertinent Vitals/Pain Pain Assessment: No/denies pain     Hand Dominance Right   Extremity/Trunk Assessment Upper Extremity Assessment Upper Extremity Assessment: LUE deficits/detail (R UE 5/5 grip 72 lbs  sensation normal 9 hole peg test 28 seconds.) LUE Deficits / Details: Range with in normal limitis shoulder 3+/5, elbow 4+/5 forearm and wrist 4/5, grip 25 lbs sensation diminished for light touch and sharp, intact for temp, delayed for stereognosis.  9 hole peg test unable. (patient became emotional when she could not pick up pegs).   Lower Extremity Assessment Lower Extremity Assessment: Defer to PT evaluation        Communication Communication Communication: No difficulties   Cognition Arousal/Alertness: Awake/alert Behavior During Therapy: WFL for tasks assessed/performed Overall Cognitive Status: Within Functional Limits for tasks assessed                     General Comments       Exercises       Shoulder Instructions      Home Living Family/patient expects to be discharged to:: Private residence Living Arrangements: Spouse/significant other Available Help at Discharge: Family Type of Home: House Home Access: Stairs to enter Technical brewer of Steps: 3 Entrance Stairs-Rails: Left Home Layout: Multi-level;Able to live on main level with bedroom/bathroom Alternate Level Stairs-Number of Steps: 12 Alternate Level Stairs-Rails: Left Bathroom Shower/Tub: Teacher, early years/pre: Standard Bathroom Accessibility: Yes   Home Equipment: None          Prior Functioning/Environment Level of Independence: Independent        Comments: Drives and works.    OT Diagnosis: Paresis   OT Problem List: Decreased strength;Decreased coordination;Impaired sensation;Decreased knowledge of use of DME or AE;Impaired tone;Impaired UE functional use   OT Treatment/Interventions: Self-care/ADL training;Therapeutic exercise;Neuromuscular education;Manual therapy    OT Goals(Current goals can be found in the care plan section) Acute Rehab OT Goals Patient Stated Goal: go back to work OT Goal Formulation: With patient/family Time For Goal Achievement:  10/12/15 Potential to Achieve Goals: Good  OT Frequency: Min 1X/week   Barriers to D/C:            Co-evaluation              End of Session Equipment Utilized During Treatment:  (stroke test kit)  Activity Tolerance:   Patient left: in bed;with call bell/phone within reach;with bed alarm set;with family/visitor present   Time: 1093-2355 OT Time Calculation (min): 20 min Charges:  OT General Charges $OT Visit: 1 Procedure OT Evaluation $OT Eval Low Complexity: 1 Procedure G-Codes:    Myrene Galas, MS/OTR/L  09/28/2015, 11:14 AM

## 2015-09-28 NOTE — Progress Notes (Signed)
PLEASE HANDLE WITH A LITTLE TLC: Patient lost her mother yesterday, spouse stated this am she is taking her mother's death hard. Did not ask spouse for details. He will be back after he takes daughter to school. I did not speak with patient about mother's death, decided to give patient the opportunity to open up on her own.

## 2015-09-28 NOTE — Consult Note (Signed)
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 One year ago diagnosed with hypertension. Did not take antihypertensive drugs regularly. 2 days ago had some weakness in the left foot. Woke up the following day with left arm and leg weakness. No change in speech or vision. Blood pressure elevated. MRI shows CVA as well as terminal carotid stenoses. Raise the question of connective tissue disease or hypercoagulable state. She has no history of joint pain. No Raynaud's. No photosensitivity. No serositis. No facial rash. No oral ulcers. No history of significant joint pain. Gets occasional headaches but usually relieved with caffeine. No change in the last 3-4 years. Blood pressure has improved with antihypertensive regimen. Still has weakness left arm and left leg. No history of murmur. Labs show mild anemia. Mild thrombocytopenia. Sedimentation rate 39. Normal white count. Normal creatinine. Father had stroke in the mid 32s but smoked regularly. No other history of DVT pulmonary emboli or stroke in her family. No history of connective tissue disease in her family  PMH: Hypertension  SURGICAL HISTORY: Tubal ligation  Family History: Negative for connective tissue disease  Social History: No cigarettes  Allergies: No Known Allergies  Medications:  Scheduled: . amLODipine  10 mg Oral Daily  . aspirin  325 mg Oral Daily  . atorvastatin  20 mg Oral q1800  . cloNIDine  0.2 mg Oral TID  . enoxaparin (LOVENOX) injection  40 mg Subcutaneous Q24H  . metoprolol tartrate  50 mg Oral BID  . pantoprazole  40 mg Oral Daily        ROS: No shortness of breath. No chest pain. No abdominal pain no visual changes. Otherwise per history of present illness   PHYSICAL EXAM: Blood pressure 123/67, pulse 61, temperature 97.7 F (36.5 C), temperature source Oral,  resp. rate 19, height  (1.702 m), weight 89.903 kg (198 lb 3.2 oz), last menstrual period 09/05/2015, SpO2 99 %. Pleasant female. Left hemiparesis left upper extremity and left lower extremity. No significant facial droop. No discoid lesions. No alopecia. No oral ulcers. Good carotid upstroke. Clear chest. Systolic murmur at the left costal border and the left axilla. No definite loud P2. No definite gallop. No visceromegaly. Good distal pulses. No significant edema. Musculskeletal without synovitis. Left upper extremity weakness. Left lower extremity weakness. Reflexes present and knee and ankle. Toe upgoing. Right lower extremity and right upper extremity normal power and reflexes  Assessment: CVA and abnormal MRA and young female. No history to suggest connective tissue disease such as lupus. No history/ evidence to suggest diffuse isolated CNS vasculitis. Rule out hypercoagulable state. Hypertension Systolic murmur Anemia. Mild thrombocytopenia  Recommendations: Agree with neurology workup. And echo. If she were to have lupus anticoagulant or anti-cardiolipin antibody she would need chronic anticoagulation  Kandyce Rud 09/28/2015, 2:07 PM

## 2015-09-28 NOTE — Consult Note (Signed)
[facility] Vascular Consult Note  MRN : [id number]  Stephanie Hawkins is a 45 y.o. (09/20/1970) female who presents with chief complaint of  Chief Complaint  Patient presents with  . Extremity Weakness  .  History of Present Illness: Patient presented to the hospital with abrupt onset of left arm and left foot weakness and numbness. She had difficulty with walking and could not really feel or move her foot as well as it should. Leg has improved since admission, but the arm remains about the same. She had accelerated hypertension at the time of admission. She has chronic hypertension which is not optimally controlled. As part of her workup, an MRI and an MRA of the brain were performed which I have independently reviewed. The MRI is positive for stroke on the right side. 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 A carotid duplex was performed which showed no significant carotid artery stenosis in either cervical carotid artery. She denies any fevers or chills or any signs of systemic infection. She did not have a headache now or at any point prior to her stroke. She denies any systemic symptoms or recent illnesses to her knowledge.  Current Facility-Administered Medications  Medication Dose Route Frequency Provider Last Rate Last Dose  . acetaminophen (TYLENOL) tablet 650 mg  650 mg Oral Q4H PRN Saundra Shelling, MD   650 mg at 09/27/15 2145   Or  . acetaminophen (TYLENOL) suppository 650 mg  650 mg Rectal Q4H PRN Saundra Shelling, MD      . amLODipine (NORVASC) tablet 10 mg  10 mg Oral Daily Vaughan Basta, MD   10 mg at 09/28/15 0910  . aspirin EC tablet 81 mg  81 mg Oral Daily Alexis Goodell, MD   81 mg at 09/28/15 1529  . atorvastatin (LIPITOR) tablet 40 mg  40 mg Oral q1800 Vaughan Basta, MD   40 mg at 09/28/15 1628  . cloNIDine (CATAPRES) tablet 0.2 mg  0.2 mg Oral TID  Vaughan Basta, MD   0.2 mg at 09/28/15 1628  . clopidogrel (PLAVIX) tablet 75 mg  75 mg Oral Daily Alexis Goodell, MD   75 mg at 09/28/15 1628  . enoxaparin (LOVENOX) injection 40 mg  40 mg Subcutaneous Q24H Saundra Shelling, MD   40 mg at 09/27/15 2041  . hydrALAZINE (APRESOLINE) injection 10 mg  10 mg Intravenous Q4H PRN Saundra Shelling, MD   10 mg at 09/28/15 9233  . metoprolol (LOPRESSOR) tablet 50 mg  50 mg Oral BID Vaughan Basta, MD   50 mg at 09/28/15 0910  . pantoprazole (PROTONIX) EC tablet 40 mg  40 mg Oral Daily Charlett Nose, RPH   40 mg at 09/28/15 0076    Past Medical History  Diagnosis Date  . Hypertension     Past Surgical History  Procedure Laterality Date  . Tubal ligation      Social History Social History  Substance Use Topics  . Smoking status: Never Smoker   . Smokeless tobacco: None  . Alcohol Use: No  No IVDU  Family History Family History  Problem Relation Age of Onset  . Heart failure Mother   . CVA Father   No known clotting disorders or bleeding disorders  No Known Allergies   REVIEW OF SYSTEMS (Negative unless checked)  Constitutional: []Weight loss  []Fever  []Chills Cardiac: []Chest pain   []Chest pressure   []Palpitations   []Shortness of  breath when laying flat   []Shortness of breath at rest   []Shortness of breath with exertion. Vascular:  []Pain in legs with walking   []Pain in legs at rest   []Pain in legs when laying flat   []Claudication   []Pain in feet when walking  []Pain in feet at rest  []Pain in feet when laying flat   []History of DVT   []Phlebitis   []Swelling in legs   []Varicose veins   []Non-healing ulcers Pulmonary:   []Uses home oxygen   []Productive cough   []Hemoptysis   []Wheeze  []COPD   []Asthma Neurologic:  []Dizziness  []Blackouts   []Seizures   [x]History of stroke   []History of TIA  []Aphasia   []Temporary blindness   []Dysphagia   [x]Weakness or numbness in arms   [x]Weakness or numbness in  legs Musculoskeletal:  []Arthritis   []Joint swelling   []Joint pain   []Low back pain Hematologic:  []Easy bruising  []Easy bleeding   []Hypercoagulable state   []Anemic  []Hepatitis Gastrointestinal:  []Blood in stool   []Vomiting blood  []Gastroesophageal reflux/heartburn   []Difficulty swallowing. Genitourinary:  []Chronic kidney disease   []Difficult urination  []Frequent urination  []Burning with urination   []Blood in urine Skin:  []Rashes   []Ulcers   []Wounds Psychological:  []History of anxiety   [] History of major depression.  Physical Examination  Filed Vitals:   09/28/15 6761 09/28/15 0702 09/28/15 0910 09/28/15 1205  BP: 185/102 166/84 136/81 123/67  Pulse: 75 91  61  Temp:    97.7 F (36.5 C)  TempSrc:    Oral  Resp:    19  Height:      Weight:      SpO2:    99%   Body mass index is 31.04 kg/(m^2). Gen:  WD/WN, NAD Head: [place]/AT, No temporalis wasting. Prominent temp pulse not noted. Ear/Nose/Throat: Hearing grossly intact, nares w/o erythema or drainage, oropharynx w/o Erythema/Exudate Eyes: PERRLA, EOMI.  Neck: Supple, no nuchal rigidity.  No bruit or JVD.  Pulmonary:  Good air movement, clear to auscultation bilaterally.  Cardiac: RRR, normal S1, S2, no Murmurs, rubs or gallops. Vascular:  Vessel Right Left  Radial Palpable Palpable                                   Gastrointestinal: soft, non-tender/non-distended. No guarding/reflex. No masses, surgical incisions, or scars. Musculoskeletal:  Extremities without ischemic changes.  No deformity or atrophy. No edema. Neurologic: CN 2-12 intact. Pain and light touch intact in extremities.  Symmetrical.  Speech is fluent. LUE with discoordination and some weakness.  Left leg strength seems to have returned to 5/5 Psychiatric: Judgment intact, Mood & affect appropriate for pt's clinical situation. Dermatologic: No rashes or ulcers noted.  No cellulitis or open wounds. Lymph : No Cervical, Axillary, or  Inguinal lymphadenopathy.    CBC Lab Results  Component Value Date   WBC 4.7 09/27/2015   HGB 10.7* 09/27/2015   HCT 32.9* 09/27/2015   MCV 82.7 09/27/2015   PLT 145* 09/27/2015    BMET    Component Value Date/Time   NA 139 09/27/2015 0058   K 3.4* 09/27/2015 0058   CL 106 09/27/2015 0058   CO2 28 09/27/2015 0058   GLUCOSE 101* 09/27/2015 0058   BUN 20 09/27/2015 0058   CREATININE 0.89 09/27/2015 0058   CALCIUM 8.9 09/27/2015 0058  GFRNONAA >60 09/27/2015 0058   GFRAA >60 09/27/2015 0058   Estimated Creatinine Clearance: 92.8 mL/min (by C-G formula based on Cr of 0.89).  COAG No results found for: INR, PROTIME  Radiology Ct Head Wo Contrast  09/27/2015  CLINICAL DATA:  Heaviness in the left lower leg since the morning of 09/26/2015. No known injury. EXAM: CT HEAD WITHOUT CONTRAST TECHNIQUE: Contiguous axial images were obtained from the base of the skull through the vertex without intravenous contrast. COMPARISON:  None. FINDINGS: Examination is limited due to streak artifact. Ventricles and sulci appear symmetrical. No ventricular dilatation. There patchy low-attenuation changes in the deep white matter. These are nonspecific and could represent small vessel ischemic changes or other demyelinating process. No abnormal extra-axial fluid collections. Gray-white matter junctions are distinct. Basal cisterns are not effaced. No acute intracranial hemorrhage. Opacification of scattered ethmoid air cells. Paranasal sinuses and mastoid air cells are otherwise not opacified. Calvarium appears intact. IMPRESSION: Nonspecific changes in the white matter. No acute intracranial hemorrhage or mass effect. Electronically Signed   By: Lucienne Capers M.D.   On: 09/27/2015 01:40   Mr Brain Wo Contrast  09/27/2015  CLINICAL DATA:  45 year old female with new onset left side weakness yesterday morning. No associated headache or speech changes. Initial encounter. EXAM: MRI HEAD WITHOUT CONTRAST  MRA HEAD WITHOUT CONTRAST TECHNIQUE: Multiplanar, multiecho pulse sequences of the brain and surrounding structures were obtained without intravenous contrast. Angiographic images of the head were obtained using MRA technique without contrast. COMPARISON:  Hip CT without contrast 0120 hours today. FINDINGS: MRI HEAD FINDINGS Curvilinear restricted diffusion tracking from the posterior right corona radiata through to the posterior right putamen (series 5, image 26, series 8, images 19 and 20). Associated T2 and FLAIR hyperintensity. No associated hemorrhage or mass effect. No other restricted diffusion. Major intracranial vascular flow voids are within normal limits. Outside of the acute findings there is scattered bilateral periventricular and central white matter T2 and FLAIR hyperintensity (series 9, image 18). No chronic cerebral blood products. No cortical encephalomalacia. 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 Aside from this in the acute findings the other deep gray matter nuclei are within normal limits. Brainstem and cerebellum within normal limits. Partially empty sella. No midline shift, mass effect, evidence of mass lesion, ventriculomegaly, extra-axial collection or acute intracranial hemorrhage. Cervicomedullary junction within normal limits. Negative visualized cervical spine. Visible internal auditory structures appear normal. Mastoids are clear. Mild to moderate paranasal sinus mucosal thickening. Orbit and scalp soft tissues are within normal limits. Visualized bone marrow signal is within normal limits. MRA HEAD FINDINGS Antegrade flow in the posterior circulation. Codominant distal vertebral arteries with no stenosis. Normal vertebrobasilar junction. Normal AICA origins. Tortuous basilar artery without stenosis. SCA and PCA origins are normal. Left posterior communicating artery is present, the right is diminutive or absent. Proximal PCA branches appear normal, but there is mild  to moderate distal PCA irregularity, most apparent in the left P3 segments. Antegrade flow in both ICA siphons, but severe irregularity at both carotid termini worse on the right. High-grade stenosis in the distal supra clinoid ICAs with evidence of right ICA terminus small collaterals (series 20, image 9). Despite this, both MCA origins remain patent. The left ACA origin is patent, the right A1 segment is diminutive or occluded. The anterior communicating artery is patent. Beyond their origins both MCA M1 segments are mildly irregular. Both MCA bifurcations are patent. No MCA branch occlusion. No definite M2 or distal  MCA branch irregularity. IMPRESSION: 1. Small acute lacunar type infarct tracking from the right posterior corona radiata to the right putamen. No hemorrhage or mass effect. 2. 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 3. 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 Additionally, there is bilateral distal PCA branch irregularity. 4. Top differential considerations include sequelae of CNS vasculitis, hypercoagulable states (antiphospholipid syndromes, sickle cell disease), connective tissue disorder (SLE), less likely age advanced atherosclerosis or thromboembolic disease (consider paradoxical emboli such as from PFO). Electronically Signed   By: Genevie Ann M.D.   On: 09/27/2015 14:55   US Carotid Bilateral  09/27/2015  CLINICAL DATA:  TIA. EXAM: BILATERAL CAROTID DUPLEX ULTRASOUND TECHNIQUE: Pearline Cables scale imaging, color Doppler and duplex ultrasound were performed of bilateral carotid and vertebral arteries in the neck. COMPARISON:  MRI 09/27/2015 FINDINGS: Criteria: Quantification of carotid stenosis is based on velocity parameters that correlate  the residual internal carotid diameter with NASCET-based stenosis levels, using the diameter of the distal internal carotid lumen as the denominator for stenosis measurement. The following velocity measurements were obtained: RIGHT ICA:  78/42 cm/sec CCA:  38/46 cm/sec SYSTOLIC ICA/CCA RATIO:  0.9 DIASTOLIC ICA/CCA RATIO:  2.2 ECA:  91 cm/sec LEFT ICA:  88/31 cm/sec CCA:  659/93 cm/sec SYSTOLIC ICA/CCA RATIO:  0.8 DIASTOLIC ICA/CCA RATIO:  1.0 ECA:  83 cm/sec RIGHT CAROTID ARTERY: Mild right carotid bifurcation plaque. No flow limiting stenosis. RIGHT VERTEBRAL ARTERY:  Patent with antegrade flow. LEFT CAROTID ARTERY:  Mild left carotid bifurcation plaque. LEFT VERTEBRAL ARTERY:  Patent with antegrade flow. Incidental note is made of a 3.5 x 3.0 x 2.2 cm complex right thyroid lobe cystic mass. Dedicated thyroid ultrasound suggested for further evaluation . IMPRESSION: 1. Mild bilateral carotid bifurcation atherosclerotic plaque. No flow limiting stenosis. Degree of stenosis less than 50%. Vertebral arteries are patent antegrade flow . 2. Incidental note is made of a 3.5 x 3.0 x 2.2 cm complex right thyroid lobe cystic mass. Dedicated thyroid ultrasound suggest for further evaluation. Electronically Signed   By: Marcello Moores  Register   On: 09/27/2015 15:36   Dg Chest Port 1 View  09/27/2015  CLINICAL DATA:  Heaviness in the left lower leg. History of hypertension. EXAM: PORTABLE CHEST 1 VIEW COMPARISON:  None. FINDINGS: Slightly shallow inspiration. Cardiac enlargement without vascular congestion. No focal airspace disease or consolidation in the lungs. No blunting of costophrenic angles. No pneumothorax. Mediastinal contours appear intact. IMPRESSION: Mild cardiac enlargement.  No evidence of active pulmonary disease. Electronically Signed   By: Lucienne Capers M.D.   On: 09/27/2015 01:24   Mr Jodene Nam Head/brain Wo Cm  09/27/2015  CLINICAL DATA:  45 year old female with new onset left side weakness yesterday morning.  No associated headache or speech changes. Initial encounter. EXAM: MRI HEAD WITHOUT CONTRAST MRA HEAD WITHOUT CONTRAST TECHNIQUE: Multiplanar, multiecho pulse sequences of the brain and surrounding structures were obtained without intravenous contrast. Angiographic images of the head were obtained using MRA technique without contrast. COMPARISON:  Hip CT without contrast 0120 hours today. FINDINGS: MRI HEAD FINDINGS Curvilinear restricted diffusion tracking from the posterior right corona radiata through to the posterior right putamen (series 5, image 26, series 8, images 19 and 20). Associated T2 and FLAIR hyperintensity. No associated hemorrhage or mass effect. No other restricted diffusion. Major intracranial vascular flow  voids are within normal limits. Outside of the acute findings there is scattered bilateral periventricular and central white matter T2 and FLAIR hyperintensity (series 9, image 18). No chronic cerebral blood products. No cortical encephalomalacia. 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 Aside from this in the acute findings the other deep gray matter nuclei are within normal limits. Brainstem and cerebellum within normal limits. Partially empty sella. No midline shift, mass effect, evidence of mass lesion, ventriculomegaly, extra-axial collection or acute intracranial hemorrhage. Cervicomedullary junction within normal limits. Negative visualized cervical spine. Visible internal auditory structures appear normal. Mastoids are clear. Mild to moderate paranasal sinus mucosal thickening. Orbit and scalp soft tissues are within normal limits. Visualized bone marrow signal is within normal limits. MRA HEAD FINDINGS Antegrade flow in the posterior circulation. Codominant distal vertebral arteries with no stenosis. Normal vertebrobasilar junction. Normal AICA origins. Tortuous basilar artery without stenosis. SCA and PCA origins are normal. Left posterior communicating artery is  present, the right is diminutive or absent. Proximal PCA branches appear normal, but there is mild to moderate distal PCA irregularity, most apparent in the left P3 segments. Antegrade flow in both ICA siphons, but severe irregularity at both carotid termini worse on the right. High-grade stenosis in the distal supra clinoid ICAs with evidence of right ICA terminus small collaterals (series 20, image 9). Despite this, both MCA origins remain patent. The left ACA origin is patent, the right A1 segment is diminutive or occluded. The anterior communicating artery is patent. Beyond their origins both MCA M1 segments are mildly irregular. Both MCA bifurcations are patent. No MCA branch occlusion. No definite M2 or distal MCA branch irregularity. IMPRESSION: 1. Small acute lacunar type infarct tracking from the right posterior corona radiata to the right putamen. No hemorrhage or mass effect. 2. 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 3. 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 Additionally, there is bilateral distal PCA branch irregularity. 4. Top differential considerations include sequelae of CNS vasculitis, hypercoagulable states (antiphospholipid syndromes, sickle cell disease), connective tissue disorder (SLE), less likely age advanced atherosclerosis or thromboembolic disease (consider paradoxical emboli such as from PFO). Electronically Signed   By: Genevie Ann M.D.   On: 09/27/2015 14:55    Assessment/Plan 1. MRI positive stroke. Still with left arm weakness. Left leg is improved. Neurology has seen. Recommend physical therapy/occupational therapy. 2. 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 Given  her only mildly elevated ESR and no systemic symptoms of headache, fever, or other signs I think this is a less likely diagnosis. MRA tends to significantly overestimate the degree of stenosis, particularly at our institution. Even if intracranial disease is present, evaluation for hypercoagulable state and treatment with anticoagulants is likely the most prudent course of action. I would like to order a CT angiogram for more detailed evaluation as this tends to be a better study then MRA at our institution. In addition, this would evaluate for a proximal lesion including an arch lesion. Duplex had suggested no significant cervical carotid stenosis, but again duplex at our institution has not had a great sensitivity and specificity previously. Pending the results of the CT angiogram, catheter-based angiogram may provide further evaluation. I would not plan this initially but this may be a follow-up test pending  the CT scan results. 3. Hypertension. Allowing some elevated blood pressure at this point is likely helpful to recover from her stroke. Long-term, getting this under better control would be of benefit.  Will review CTA and return in follow up to discuss options.  DEW,JASON, MD  09/28/2015 5:15 PM

## 2015-09-28 NOTE — Progress Notes (Addendum)
Patient A/O, no noted distress. C/o headache administered tylenol, effective (see EMar). Patient she is left side weakness, used gait belt for ambulation. Family tries to assist, educated spouse and daughter to let staff ambulate her until PT works with them. Tolerated meds well. Staff will continue to monitor and meet needs. Educated on stroke and compliance with medication. Administered prn hydralazine resulted in am bp

## 2015-09-29 ENCOUNTER — Encounter
Admission: EM | Disposition: A | Discharge status: Home / Self Care | Payer: Self-pay | Source: Home / Self Care | Attending: Internal Medicine

## 2015-09-29 ENCOUNTER — Encounter: Payer: Self-pay | Admitting: Cardiovascular Disease

## 2015-09-29 ENCOUNTER — Inpatient Hospital Stay
Admit: 2015-09-29 | Discharge: 2015-09-29 | Disposition: A | Discharge status: Home / Self Care | Payer: BC Managed Care – PPO | Attending: Internal Medicine | Admitting: Internal Medicine

## 2015-09-29 HISTORY — PX: TEE WITHOUT CARDIOVERSION: SHX5443

## 2015-09-29 LAB — BASIC METABOLIC PANEL
ANION GAP: 7 (ref 5–15)
BUN: 18 mg/dL (ref 6–20)
CO2: 27 mmol/L (ref 22–32)
Calcium: 8.9 mg/dL (ref 8.9–10.3)
Chloride: 106 mmol/L (ref 101–111)
Creatinine, Ser: 0.96 mg/dL (ref 0.44–1.00)
GFR calc Af Amer: >60 mL/min (ref >60)
GFR calc non Af Amer: >60 mL/min (ref >60)
GLUCOSE: 98 mg/dL (ref 65–99)
POTASSIUM: 3.8 mmol/L (ref 3.5–5.1)
Sodium: 140 mmol/L (ref 135–145)

## 2015-09-29 LAB — RPR: RPR Ser Ql: REACTIVE — AB

## 2015-09-29 LAB — CBC
HEMATOCRIT: 31.5 % — AB (ref 35.0–47.0)
Hemoglobin: 10.2 g/dL — ABNORMAL LOW (ref 12.0–16.0)
MCH: 26.6 pg (ref 26.0–34.0)
MCHC: 32.2 g/dL (ref 32.0–36.0)
MCV: 82.6 fL (ref 80.0–100.0)
Platelets: 151 10*3/uL (ref 150–440)
RBC: 3.81 MIL/uL (ref 3.80–5.20)
RDW: 14 % (ref 11.5–14.5)
WBC: 5.2 10*3/uL (ref 3.6–11.0)

## 2015-09-29 LAB — ANTINUCLEAR ANTIBODIES, IFA: ANA Ab, IFA: NEGATIVE

## 2015-09-29 LAB — COMPLEMENT, TOTAL

## 2015-09-29 LAB — RPR, QUANT+TP ABS (REFLEX)
Rapid Plasma Reagin, Quant: 1:1 {titer} — ABNORMAL HIGH
T Pallidum Abs: POSITIVE — AB

## 2015-09-29 LAB — C3 COMPLEMENT: C3 Complement: 153 mg/dL (ref 82–167)

## 2015-09-29 LAB — C4 COMPLEMENT: Complement C4, Body Fluid: 40 mg/dL (ref 14–44)

## 2015-09-29 LAB — HEMOGLOBIN A1C: HEMOGLOBIN A1C: 4.8 % (ref 4.0–6.0)

## 2015-09-29 SURGERY — ECHOCARDIOGRAM, TRANSESOPHAGEAL
Anesthesia: Moderate Sedation

## 2015-09-29 MED ORDER — MIDAZOLAM HCL 5 MG/5ML IJ SOLN
INTRAMUSCULAR | Status: AC | PRN
  Administered 2015-09-29: 09:00:00 1 mg via INTRAVENOUS

## 2015-09-29 MED ORDER — ONDANSETRON HCL 4 MG/2ML IJ SOLN
INTRAMUSCULAR | Status: DC | PRN
  Administered 2015-09-29: 4 mg via INTRAVENOUS

## 2015-09-29 MED ORDER — HYDRALAZINE HCL 20 MG/ML IJ SOLN
INTRAMUSCULAR | Status: AC
  Filled 2015-09-29: qty 1

## 2015-09-29 MED ORDER — LISINOPRIL 10 MG PO TABS: 10.0000 mg | ORAL_TABLET | Freq: Every day | ORAL | Status: DC

## 2015-09-29 MED ORDER — LIDOCAINE VISCOUS 2 % MT SOLN
OROMUCOSAL | Status: AC
  Filled 2015-09-29: qty 15

## 2015-09-29 MED ORDER — BUTAMBEN-TETRACAINE-BENZOCAINE 2-2-14 % EX AERO
INHALATION_SPRAY | CUTANEOUS | Status: AC
  Filled 2015-09-29: qty 20

## 2015-09-29 MED ORDER — METOPROLOL TARTRATE 50 MG PO TABS: 50.0000 mg | ORAL_TABLET | Freq: Two times a day (BID) | ORAL | Status: AC

## 2015-09-29 MED ORDER — SODIUM CHLORIDE 0.9 % IJ SOLN
INTRAMUSCULAR | Status: AC
  Filled 2015-09-29: qty 3

## 2015-09-29 MED ORDER — ONDANSETRON HCL 4 MG/2ML IJ SOLN
INTRAMUSCULAR | Status: AC
  Filled 2015-09-29: qty 2

## 2015-09-29 MED ORDER — FENTANYL CITRATE (PF) 100 MCG/2ML IJ SOLN
INTRAMUSCULAR | Status: AC | PRN
Start: 2015-09-29 — End: 2015-09-29
  Administered 2015-09-29: 50 ug via INTRAVENOUS

## 2015-09-29 MED ORDER — AMLODIPINE BESYLATE 10 MG PO TABS
10.0000 mg | ORAL_TABLET | Freq: Every day | ORAL | Status: AC
Start: 2015-09-29 — End: ?

## 2015-09-29 MED ORDER — ATORVASTATIN CALCIUM 40 MG PO TABS: 40.0000 mg | ORAL_TABLET | Freq: Every day | ORAL | Status: AC

## 2015-09-29 MED ORDER — FENTANYL CITRATE (PF) 100 MCG/2ML IJ SOLN
INTRAMUSCULAR | Status: AC
  Filled 2015-09-29: qty 2

## 2015-09-29 MED ORDER — LISINOPRIL 10 MG PO TABS
10.0000 mg | ORAL_TABLET | Freq: Every day | ORAL | Status: DC
Start: 2015-09-29 — End: 2015-09-29
  Administered 2015-09-29: 10 mg via ORAL
  Filled 2015-09-29: qty 1

## 2015-09-29 MED ORDER — MIDAZOLAM HCL 5 MG/5ML IJ SOLN
INTRAMUSCULAR | Status: AC
  Filled 2015-09-29: qty 5

## 2015-09-29 MED ORDER — ASPIRIN EC 325 MG PO TBEC: 325.0000 mg | DELAYED_RELEASE_TABLET | Freq: Every day | ORAL | Status: AC

## 2015-09-29 MED ORDER — CLONIDINE HCL 0.1 MG PO TABS
0.1000 mg | ORAL_TABLET | Freq: Three times a day (TID) | ORAL | Status: AC
Start: 2015-09-29 — End: ?

## 2015-09-29 NOTE — Discharge Planning (Signed)
Pt IV removed.  Discharge papers given, explained and educated.  Told of suggested FU appts and also given scripts.  RN assessment and VS revealed stability for DC to home.  Pt will be wheeled to car and family is transporting home via car.

## 2015-09-29 NOTE — Consult Note (Signed)
Stephanie Hawkins is a 45 y.o. female  [id number]  Primary Cardiologist: Sherrelle Prochazka Reason for Consultation: TEE  HPI: [id number] here for TEE as had CVA.   Review of Systems: no chest pains or swollowing problems.   Past Medical History  Diagnosis Date  . Hypertension     No prescriptions prior to admission     . amLODipine  10 mg Oral Daily  . aspirin EC  81 mg Oral Daily  . atorvastatin  40 mg Oral q1800  . butamben-tetracaine-benzocaine      . cloNIDine  0.1 mg Oral TID  . clopidogrel  75 mg Oral Daily  . enoxaparin (LOVENOX) injection  40 mg Subcutaneous Q24H  . fentaNYL      . lidocaine      . metoprolol tartrate  50 mg Oral BID  . midazolam      . ondansetron      . pantoprazole  40 mg Oral Daily  . sodium chloride        Infusions:    No Known Allergies  Social History   Social History  . Marital Status: Married    Spouse Name: N/A  . Number of Children: N/A  . Years of Education: N/A   Occupational History  . works in [employer] school system    Social History Main Topics  . Smoking status: Never Smoker   . Smokeless tobacco: Not on file  . Alcohol Use: No  . Drug Use: No  . Sexual Activity: Not on file   Other Topics Concern  . Not on file   Social History Narrative   Lives with family    Family History  Problem Relation Age of Onset  . Heart failure Mother   . CVA Father     PHYSICAL EXAM: Filed Vitals:   09/29/15 0908 09/29/15 0911  BP: 158/126 243/122  Pulse: 94 106  Temp:    Resp: 14 20     Intake/Output Summary (Last 24 hours) at 09/29/15 0914 Last data filed at 09/28/15 1700  Gross per 24 hour  Intake    360 ml  Output      0 ml  Net    360 ml    General:  Well appearing. No respiratory difficulty HEENT: normal Neck: supple. no JVD. Carotids 2+ bilat; no bruits. No lymphadenopathy or thryomegaly appreciated. Cor: PMI nondisplaced. Regular rate & rhythm. No rubs, gallops or murmurs. Lungs: clear Abdomen: soft,  nontender, nondistended. No hepatosplenomegaly. No bruits or masses. Good bowel sounds. Extremities: no cyanosis, clubbing, rash, edema Neuro: alert & oriented x 3, cranial nerves grossly intact. moves all 4 extremities w/o difficulty. Affect pleasant.  ECG: NSR, no acute changes  Results for orders placed or performed during the hospital encounter of 09/27/15 (from the past 24 hour(s))  Sedimentation rate     Status: Abnormal   Collection Time: 09/28/15 10:23 AM  Result Value Ref Range   Sed Rate 39 (H) 0 - 20 mm/hr  C-reactive protein     Status: None   Collection Time: 09/28/15 10:23 AM  Result Value Ref Range   CRP 0.6 <1.0 mg/dL  Antithrombin III     Status: None   Collection Time: 09/28/15 11:31 AM  Result Value Ref Range   AntiThromb III Func 90 75 - 120 %  CBC     Status: Abnormal   Collection Time: 09/29/15  5:54 AM  Result Value Ref Range   WBC 5.2 3.6 - 11.0 K/uL  RBC 3.81 3.80 - 5.20 MIL/uL   Hemoglobin 10.2 (L) 12.0 - 16.0 g/dL   HCT 16.1 (L) 09.6 - 04.5 %   MCV 82.6 80.0 - 100.0 fL   MCH 26.6 26.0 - 34.0 pg   MCHC 32.2 32.0 - 36.0 g/dL   RDW 40.9 81.1 - 91.4 %   Platelets 151 150 - 440 K/uL  Basic metabolic panel     Status: None   Collection Time: 09/29/15  5:54 AM  Result Value Ref Range   Sodium 140 135 - 145 mmol/L   Potassium 3.8 3.5 - 5.1 mmol/L   Chloride 106 101 - 111 mmol/L   CO2 27 22 - 32 mmol/L   Glucose, Bld 98 65 - 99 mg/dL   BUN 18 6 - 20 mg/dL   Creatinine, Ser 7.82 0.44 - 1.00 mg/dL   Calcium 8.9 8.9 - 95.6 mg/dL   GFR calc non Af Amer >60 >60 mL/min   GFR calc Af Amer >60 >60 mL/min   Anion gap 7 5 - 15   Ct Angio Neck W/cm &/or Wo/cm  09/28/2015  CLINICAL DATA:  Follow-up examination for stroke, left-sided weakness, abnormal intracranial MRA. EXAM: CT ANGIOGRAPHY NECK TECHNIQUE: Multidetector CT imaging of the neck was performed using the standard protocol during bolus administration of intravenous contrast. Multiplanar CT image  reconstructions and MIPs were obtained to evaluate the vascular anatomy. Carotid stenosis measurements (when applicable) are obtained utilizing NASCET criteria, using the distal internal carotid diameter as the denominator. CONTRAST:  OMNIPAQUE IOHEXOL 350 MG/ML SOLN COMPARISON:  None. FINDINGS: Aortic arch: The visualized aortic arch is of normal caliber and appearance without significant atheromatous disease. No intraluminal thrombus or other abnormality. Note is made of a bovine arch with common origin of the right brachiocephalic and left common carotid artery. The visualized subclavian arteries are well opacified without stenosis or other abnormality. No significant irregularity seen about the visualized subclavian arteries. Right carotid system: Origin of the right common carotid artery widely patent. The proximal right common carotid artery is tortuous. Distally, there is circumferential wall thickening with mild intimal irregularity about the distal right common carotid artery (series 6, image 22). Wall thickening extends to the right carotid bifurcation without associated stenosis. Intimal irregularity with mild wall thickening about the proximal right ICA as well (series 6, image 76). There is a somewhat abrupt caliber change within the proximal ICA just distally without high-grade stenosis. Distally, the right ICA is mildly tortuous but widely patent to the circle of Willis. There is question of minimal intimal irregularity within the mid and distal right ICA, although this is certainly less apparent as compared to its proximal aspect. No dissection flap or flow limiting stenosis. These changes appear to largely spare the right external carotid artery and its branches. Left carotid system: Left common carotid artery widely patent at its origin. The left common carotid artery is tortuous proximally. Similar to the distal right common carotid artery, there is subtle intimal irregularity with  circumferential wall thickening about the distal left common carotid artery (series 6, image 88). No flow limiting stenosis. Changes extend into the left carotid bifurcation and proximal left ICA. Similar to the right, there is somewhat abrupt narrowing of the proximal left ICA with mild tortuosity. Mild wall thickening with intimal irregularity seen within the proximal left ICA as well. Left ICA is widely patent to the circle of Willis without flow-limiting stenosis. Similar to the right side, these changes appear to largely spare the  left external carotid artery and its branches. Vertebral arteries:Both vertebral arteries arise from the subclavian arteries. The right vertebral artery is slightly dominant. Vertebral arteries are widely patent to the skullbase without flow-limiting stenosis, dissection, or occlusion. No definite irregularity seen about the vertebral arteries as seen a in the carotid arteries system in the neck. However, the intracranial V4 segments demonstrate subtle multifocal irregularity. In addition, the partially visualized vertebrobasilar junction and proximal basilar artery are irregular in appearance with multi focal narrowing, similar did changes seen on previous MRA (series 6, image 6). Skeleton: No acute osseous abnormality. No worrisome lytic or blastic osseous lesions. Mild degenerative spondylolysis present at C5-6. Other neck: 7 mm nodule present in the right upper lobe (series 6, image 205). Visualized lungs are otherwise clear without acute process. Visualized superior mediastinum within normal limits. Heterogeneous nodule measuring up to 2.9 cm present within the right thyroid lobe. Few additional scattered subcentimeter nodules noted within the thyroid gland bilaterally. No pathologically enlarged lymph nodes identified within the neck. No acute soft tissue abnormality. IMPRESSION: 1. Mild intimal irregularity with subtle circumferential soft tissue thickening about the distal  common and proximal internal carotid arteries bilaterally. The appearance of these vessels is felt to be abnormal. Primary differential considerations include underlying vasculitis, connective tissue disorder, or possibly age advanced atheromatous disease, although this is felt to be less likely. These changes do not have the typical appearance for FMD. 2. Relative sparing of the vertebral arteries in the neck, which are widely patent to the skullbase. However, the intracranial V4 segments demonstrate subtle irregularity, with more obvious irregularity and multifocal narrowing at the partially visualized vertebrobasilar junction and proximal basilar artery, similar to the changes seen on previous MRA. 3. Heterogeneous right thyroid nodule measuring up to 2.9 cm. Correlation with dedicated thyroid ultrasound recommended if not already performed. 4. 7 mm right upper lobe nodule, indeterminate. If the patient is at high risk for bronchogenic carcinoma, follow-up chest CT at 3-80months is recommended. If the patient is at low risk for bronchogenic carcinoma, follow-up chest CT at 6-12 months is recommended. This recommendation follows the consensus statement: Guidelines for Management of Small Pulmonary Nodules Detected on CT Scans: A Statement from the Fleischner Society as published in Radiology 2005; 237:395-400. Electronically Signed   By: Rise Mu M.D.   On: 09/28/2015 21:01   Mr Brain Wo Contrast  09/27/2015  CLINICAL DATA:  45 year old female with new onset left side weakness yesterday morning. No associated headache or speech changes. Initial encounter. EXAM: MRI HEAD WITHOUT CONTRAST MRA HEAD WITHOUT CONTRAST TECHNIQUE: Multiplanar, multiecho pulse sequences of the brain and surrounding structures were obtained without intravenous contrast. Angiographic images of the head were obtained using MRA technique without contrast. COMPARISON:  Hip CT without contrast 0120 hours today. FINDINGS: MRI HEAD  FINDINGS Curvilinear restricted diffusion tracking from the posterior right corona radiata through to the posterior right putamen (series 5, image 26, series 8, images 19 and 20). Associated T2 and FLAIR hyperintensity. No associated hemorrhage or mass effect. No other restricted diffusion. Major intracranial vascular flow voids are within normal limits. Outside of the acute findings there is scattered bilateral periventricular and central white matter T2 and FLAIR hyperintensity (series 9, image 18). No chronic cerebral blood products. No cortical encephalomalacia. 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 Aside from this in the acute findings the other deep gray matter nuclei are within normal limits. Brainstem and cerebellum within normal limits. Partially empty sella. No midline  shift, mass effect, evidence of mass lesion, ventriculomegaly, extra-axial collection or acute intracranial hemorrhage. Cervicomedullary junction within normal limits. Negative visualized cervical spine. Visible internal auditory structures appear normal. Mastoids are clear. Mild to moderate paranasal sinus mucosal thickening. Orbit and scalp soft tissues are within normal limits. Visualized bone marrow signal is within normal limits. MRA HEAD FINDINGS Antegrade flow in the posterior circulation. Codominant distal vertebral arteries with no stenosis. Normal vertebrobasilar junction. Normal AICA origins. Tortuous basilar artery without stenosis. SCA and PCA origins are normal. Left posterior communicating artery is present, the right is diminutive or absent. Proximal PCA branches appear normal, but there is mild to moderate distal PCA irregularity, most apparent in the left P3 segments. Antegrade flow in both ICA siphons, but severe irregularity at both carotid termini worse on the right. High-grade stenosis in the distal supra clinoid ICAs with evidence of right ICA terminus small collaterals (series 20, image 9). Despite  this, both MCA origins remain patent. The left ACA origin is patent, the right A1 segment is diminutive or occluded. The anterior communicating artery is patent. Beyond their origins both MCA M1 segments are mildly irregular. Both MCA bifurcations are patent. No MCA branch occlusion. No definite M2 or distal MCA branch irregularity. IMPRESSION: 1. Small acute lacunar type infarct tracking from the right posterior corona radiata to the right putamen. No hemorrhage or mass effect. 2. 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 3. 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 Additionally, there is bilateral distal PCA branch irregularity. 4. Top differential considerations include sequelae of CNS vasculitis, hypercoagulable states (antiphospholipid syndromes, sickle cell disease), connective tissue disorder (SLE), less likely age advanced atherosclerosis or thromboembolic disease (consider paradoxical emboli such as from PFO). Electronically Signed   By: Odessa Fleming M.D.   On: 09/27/2015 14:55   US Carotid Bilateral  09/27/2015  CLINICAL DATA:  TIA. EXAM: BILATERAL CAROTID DUPLEX ULTRASOUND TECHNIQUE: Wallace Cullens scale imaging, color Doppler and duplex ultrasound were performed of bilateral carotid and vertebral arteries in the neck. COMPARISON:  MRI 09/27/2015 FINDINGS: Criteria: Quantification of carotid stenosis is based on velocity parameters that correlate the residual internal carotid diameter with NASCET-based stenosis levels, using the diameter of the distal internal carotid lumen as the denominator for stenosis measurement. The following velocity measurements were obtained: RIGHT ICA:  78/42 cm/sec CCA:  91/19 cm/sec SYSTOLIC ICA/CCA RATIO:  0.9 DIASTOLIC ICA/CCA  RATIO:  2.2 ECA:  91 cm/sec LEFT ICA:  88/31 cm/sec CCA:  108/31 cm/sec SYSTOLIC ICA/CCA RATIO:  0.8 DIASTOLIC ICA/CCA RATIO:  1.0 ECA:  83 cm/sec RIGHT CAROTID ARTERY: Mild right carotid bifurcation plaque. No flow limiting stenosis. RIGHT VERTEBRAL ARTERY:  Patent with antegrade flow. LEFT CAROTID ARTERY:  Mild left carotid bifurcation plaque. LEFT VERTEBRAL ARTERY:  Patent with antegrade flow. Incidental note is made of a 3.5 x 3.0 x 2.2 cm complex right thyroid lobe cystic mass. Dedicated thyroid ultrasound suggested for further evaluation . IMPRESSION: 1. Mild bilateral carotid bifurcation atherosclerotic plaque. No flow limiting stenosis. Degree of stenosis less than 50%. Vertebral arteries are patent antegrade flow . 2. Incidental note is made of a 3.5 x 3.0 x 2.2 cm complex right thyroid lobe cystic mass. Dedicated thyroid ultrasound suggest for further evaluation. Electronically Signed   By: Maisie Fus  Register   On: 09/27/2015 15:36  Mr Maxine Glenn Head/brain Wo Cm  09/27/2015  CLINICAL DATA:  45 year old female with new onset left side weakness yesterday morning. No associated headache or speech changes. Initial encounter. EXAM: MRI HEAD WITHOUT CONTRAST MRA HEAD WITHOUT CONTRAST TECHNIQUE: Multiplanar, multiecho pulse sequences of the brain and surrounding structures were obtained without intravenous contrast. Angiographic images of the head were obtained using MRA technique without contrast. COMPARISON:  Hip CT without contrast 0120 hours today. FINDINGS: MRI HEAD FINDINGS Curvilinear restricted diffusion tracking from the posterior right corona radiata through to the posterior right putamen (series 5, image 26, series 8, images 19 and 20). Associated T2 and FLAIR hyperintensity. No associated hemorrhage or mass effect. No other restricted diffusion. Major intracranial vascular flow voids are within normal limits. Outside of the acute findings there is scattered bilateral periventricular and central white  matter T2 and FLAIR hyperintensity (series 9, image 18). No chronic cerebral blood products. No cortical encephalomalacia. 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 Aside from this in the acute findings the other deep gray matter nuclei are within normal limits. Brainstem and cerebellum within normal limits. Partially empty sella. No midline shift, mass effect, evidence of mass lesion, ventriculomegaly, extra-axial collection or acute intracranial hemorrhage. Cervicomedullary junction within normal limits. Negative visualized cervical spine. Visible internal auditory structures appear normal. Mastoids are clear. Mild to moderate paranasal sinus mucosal thickening. Orbit and scalp soft tissues are within normal limits. Visualized bone marrow signal is within normal limits. MRA HEAD FINDINGS Antegrade flow in the posterior circulation. Codominant distal vertebral arteries with no stenosis. Normal vertebrobasilar junction. Normal AICA origins. Tortuous basilar artery without stenosis. SCA and PCA origins are normal. Left posterior communicating artery is present, the right is diminutive or absent. Proximal PCA branches appear normal, but there is mild to moderate distal PCA irregularity, most apparent in the left P3 segments. Antegrade flow in both ICA siphons, but severe irregularity at both carotid termini worse on the right. High-grade stenosis in the distal supra clinoid ICAs with evidence of right ICA terminus small collaterals (series 20, image 9). Despite this, both MCA origins remain patent. The left ACA origin is patent, the right A1 segment is diminutive or occluded. The anterior communicating artery is patent. Beyond their origins both MCA M1 segments are mildly irregular. Both MCA bifurcations are patent. No MCA branch occlusion. No definite M2 or distal MCA branch irregularity. IMPRESSION: 1. Small acute lacunar type infarct tracking from the right posterior corona radiata to the right  putamen. No hemorrhage or mass effect. 2. 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 3. 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 Additionally, there is bilateral distal PCA branch irregularity. 4. Top differential considerations include sequelae of CNS vasculitis, hypercoagulable states (antiphospholipid syndromes, sickle cell disease), connective tissue disorder (SLE), less likely age advanced atherosclerosis or thromboembolic disease (consider paradoxical emboli such as from PFO). Electronically Signed   By: Odessa Fleming M.D.   On: 09/27/2015 14:55     ASSESSMENT AND PLAN: TEE showed normal LVEF, mild MR, LA/LAA, and LV had no thrombii and bubble study was negative.  Keaden Gunnoe A

## 2015-09-29 NOTE — Progress Notes (Signed)
[facility]         [place], [place].   09/29/2015  Patient: Stephanie Hawkins   Date of Birth:  09/06/1970  Date of admission:  09/27/2015  Date of Discharge  09/29/2015    To Whom it May Concern:   Stephanie Hawkins  may return to work on 10/04/15.  PHYSICAL ACTIVITY:  Moderate, may use walker to walk around.  If you have any questions or concerns, please don't hesitate to call.  Sincerely,   Altamese Dilling M.D Pager [phone] Office : [phone]   .

## 2015-09-29 NOTE — Progress Notes (Signed)
Occupational Therapy Treatment Patient Details Name: Stephanie Hawkins MRN: [id number] DOB: August 15, 1971 Today's Date: 09/29/2015    History of present illness Stephanie Hawkins is a 45 y.o. female with a known history of hypertension presented to the emergency room with left lower extremity weakness since yesterday morning. Patient woke up yesterday morning was trying to go to the bathroom to brush her teeth. She felt weak in her left leg and also noticed some weakness in the left upper extremity. Patient says her left leg was weaker than the left upper extremity when she first noticed it. No history of any slurred speech. No complaints of any tingling or numbness in any part of the body. Prior history of any stroke. Patient not taking blood pressure medication for the last 1 year. Does not follow up with any primary care physician for the last few years. When patient presented to the emergency room her blood pressure was high and systolic blood pressure was more than 210 mm of Hg and diastolic pressure was around 160 mm of Hg. Patient was given IV hydralazine in the emergency room for control of blood pressure. Initial CT scan of the head did not show any intracranial abnormality. No history of any headache dizziness or blurry vision. No history of any fall or any seizures. Attempted to evaluate yesterday but she was out of room for testing as well as with elevated BP throughout the day. Pt reports full independence with community mobility prior to arrival. She drives and works. Brain MRI showed small acute lacunar CVA R posterior corona radiata to R putamen as well as chronic L CVA. Of note pt was advised that her mother passed away yesterday 10/15/2015.    OT comments  Pt seen in room with 2 daughters and discussed set up at home to ensure safety and prevent falls.  Rec a shower chair with back and non slip mat inside and out of tub and grab bars in shower and by bath tub. Given an Research officer, political party with  resources for ADLs such as button hook, reacher and adaptive feeding utensils in case LUE and hand weakness gets worse and affects ADLs more.  Rec outpatient OT and family indicated only PT was ordered at outpatient and rec they ask for OT when they go to first visit if needed.    Follow Up Recommendations  Outpatient OT    Equipment Recommendations  Tub/shower seat    Recommendations for Other Services      Precautions / Restrictions Precautions Precautions: None Restrictions Weight Bearing Restrictions: No       Mobility Bed Mobility                  Transfers                      Balance                                   ADL                                         General ADL Comments: Pt seen in room with her 2 daughters and  discussed safe set up at home with showering and rec shower chair with back and non slip mat both in and out of tub to prevent  falls and use a bag on FWW when ambulating.  Rec she ask for Outpatient OT if PT is not able to address increasing function in LUE and hand .      Vision                     Perception     Praxis      Cognition   Behavior During Therapy: WFL for tasks assessed/performed Overall Cognitive Status: Within Functional Limits for tasks assessed                       Extremity/Trunk Assessment               Exercises     Shoulder Instructions       General Comments      Pertinent Vitals/ Pain       Pain Assessment: No/denies pain  Home Living                                          Prior Functioning/Environment              Frequency Min 1X/week     Progress Toward Goals  OT Goals(current goals can now be found in the care plan section)  Progress towards OT goals: Progressing toward goals  Acute Rehab OT Goals Patient Stated Goal: go back to work OT Goal Formulation: With patient/family Time For Goal  Achievement: 10/06/15 Potential to Achieve Goals: Good  Plan Discharge plan remains appropriate    Co-evaluation                 End of Session Equipment Utilized During Treatment:  (adaptive equip catalog)   Activity Tolerance Patient tolerated treatment well   Patient Left in bed;with call bell/phone within reach;with family/visitor present   Nurse Communication          Time: 1430-1500 OT Time Calculation (min): 30 min  Charges: OT General Charges $OT Visit: 1 Procedure OT Treatments $Self Care/Home Management : 8-22 mins $Therapeutic Activity: 8-22 mins  Wofford,Susan 09/29/2015, 3:08 PM  Susanne Borders, OTR/L ascom [phone]

## 2015-09-29 NOTE — Progress Notes (Signed)
*  PRELIMINARY RESULTS* Echocardiogram Echocardiogram Transesophageal has been performed.  Georgann Housekeeper Hege 09/29/2015, 9:25 AM

## 2015-09-29 NOTE — Progress Notes (Signed)
[facility]  Daily Progress Note   Subjective  - Day of Surgery  Feeling a little better.  Arm improving.  No new complaints Seen down in recovery after TEE today  Objective Filed Vitals:   09/29/15 0912 09/29/15 0915 09/29/15 0930 09/29/15 0945  BP:  193/109 160/97 162/94  Pulse: 98 98 75 84  Temp:      TempSrc:      Resp: Height:      Weight:      SpO2: 90% 95% 94% 96%    Intake/Output Summary (Last 24 hours) at 09/29/15 1005 Last data filed at 09/28/15 1700  Gross per 24 hour  Intake    360 ml  Output      0 ml  Net    360 ml    PULM  CTAB CV  RRR VASC  Left arm strength improving  Laboratory CBC    Component Value Date/Time   WBC 5.2 09/29/2015 0554   HGB 10.2* 09/29/2015 0554   HCT 31.5* 09/29/2015 0554   PLT 151 09/29/2015 0554    BMET    Component Value Date/Time   NA 140 09/29/2015 0554   K 3.8 09/29/2015 0554   CL 106 09/29/2015 0554   CO2 27 09/29/2015 0554   GLUCOSE 98 09/29/2015 0554   BUN 18 09/29/2015 0554   CREATININE 0.96 09/29/2015 0554   CALCIUM 8.9 09/29/2015 0554   GFRNONAA >60 09/29/2015 0554   GFRAA >60 09/29/2015 0554    CLINICAL DATA: Follow-up examination for stroke, left-sided weakness, abnormal intracranial MRA.  EXAM: CT ANGIOGRAPHY NECK  TECHNIQUE: Multidetector CT imaging of the neck was performed using the standard protocol during bolus administration of intravenous contrast. Multiplanar CT image reconstructions and MIPs were obtained to evaluate the vascular anatomy. Carotid stenosis measurements (when applicable) are obtained utilizing NASCET criteria, using the distal internal carotid diameter as the denominator.  CONTRAST: OMNIPAQUE IOHEXOL 350 MG/ML SOLN  COMPARISON: None.  FINDINGS: Aortic arch: The visualized aortic arch is of normal caliber and appearance without significant atheromatous disease. No intraluminal thrombus or other abnormality. Note  is made of a bovine arch with common origin of the right brachiocephalic and left common carotid artery. The visualized subclavian arteries are well opacified without stenosis or other abnormality. No significant irregularity seen about the visualized subclavian arteries.  Right carotid system: Origin of the right common carotid artery widely patent. The proximal right common carotid artery is tortuous. Distally, there is circumferential wall thickening with mild intimal irregularity about the distal right common carotid artery (series 6, image 22). Wall thickening extends to the right carotid bifurcation without associated stenosis. Intimal irregularity with mild wall thickening about the proximal right ICA as well (series 6, image 76). There is a somewhat abrupt caliber change within the proximal ICA just distally without high-grade stenosis. Distally, the right ICA is mildly tortuous but widely patent to the circle of Willis. There is question of minimal intimal irregularity within the mid and distal right ICA, although this is certainly less apparent as compared to its proximal aspect. No dissection flap or flow limiting stenosis. These changes appear to largely spare the right external carotid artery and its branches.  Left carotid system: Left common carotid artery widely patent at its origin. The left common carotid artery is tortuous proximally. Similar to the distal right common carotid artery, there is subtle intimal irregularity with circumferential wall thickening about the distal left common carotid  artery (series 6, image 88). No flow limiting stenosis. Changes extend into the left carotid bifurcation and proximal left ICA. Similar to the right, there is somewhat abrupt narrowing of the proximal left ICA with mild tortuosity. Mild wall thickening with intimal irregularity seen within the proximal left ICA as well. Left ICA is widely patent to the circle of  Willis without flow-limiting stenosis. Similar to the right side, these changes appear to largely spare the left external carotid artery and its branches.  Vertebral arteries:Both vertebral arteries arise from the subclavian arteries. The right vertebral artery is slightly dominant. Vertebral arteries are widely patent to the skullbase without flow-limiting stenosis, dissection, or occlusion. No definite irregularity seen about the vertebral arteries as seen a in the carotid arteries system in the neck. However, the intracranial V4 segments demonstrate subtle multifocal irregularity. In addition, the partially visualized vertebrobasilar junction and proximal basilar artery are irregular in appearance with multi focal narrowing, similar did changes seen on previous MRA (series 6, image 6).  Skeleton: No acute osseous abnormality. No worrisome lytic or blastic osseous lesions. Mild degenerative spondylolysis present at C5-6.  Other neck: 7 mm nodule present in the right upper lobe (series 6, image 205). Visualized lungs are otherwise clear without acute process. Visualized superior mediastinum within normal limits. Heterogeneous nodule measuring up to 2.9 cm present within the right thyroid lobe. Few additional scattered subcentimeter nodules noted within the thyroid gland bilaterally. No pathologically enlarged lymph nodes identified within the neck. No acute soft tissue abnormality.  IMPRESSION: 1. Mild intimal irregularity with subtle circumferential soft tissue thickening about the distal common and proximal internal carotid arteries bilaterally. The appearance of these vessels is felt to be abnormal. Primary differential considerations include underlying vasculitis, connective tissue disorder, or possibly age advanced atheromatous disease, although this is felt to be less likely. These changes do not have the typical appearance for FMD. 2. Relative sparing of the vertebral  arteries in the neck, which are widely patent to the skullbase. However, the intracranial V4 segments demonstrate subtle irregularity, with more obvious irregularity and multifocal narrowing at the partially visualized vertebrobasilar junction and proximal basilar artery, similar to the changes seen on previous MRA. 3. Heterogeneous right thyroid nodule measuring up to 2.9 cm. Correlation with dedicated thyroid ultrasound recommended if not already performed. 4. 7 mm right upper lobe nodule, indeterminate. If the patient is at high risk for bronchogenic carcinoma, follow-up chest CT at 3-75months is recommended. If the patient is at low risk for bronchogenic carcinoma, follow-up chest CT at 6-12 months is recommended. This recommendation follows the consensus statement: Guidelines for Management of Small Pulmonary Nodules Detected on CT Scans: A Statement from the Fleischner Society as published in Radiology 2005; 237:395-400.   Electronically Signed  By: Rise Mu M.D.  On: 09/28/2015 21:01  Assessment/Planning:  Stroke, slowly improving.  Appreciate neurology input. Had TEE today  CTA reviewed.  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  Clinically, she does not have typical appearance of vasculitis but could consider steroids and reimaging in a few weeks to assess results.  May also benefit from outpatient evaluation with neurosurgery.  No surgery or intervention likely to be of any benefits from vascular POV at this time.  Stephanie Hawkins  09/29/2015, 10:05 AM

## 2015-09-29 NOTE — Progress Notes (Addendum)
Subjective: No new neurological complaints.    Objective: Current vital signs: BP 137/78 mmHg  Pulse 67  Temp(Src) 98.2 F (36.8 C) (Oral)  Resp 19  Ht 5\' 7"  (1.702 m)  Wt 89.903 kg (198 lb 3.2 oz)  BMI 31.04 kg/m2  SpO2 96%  LMP 09/05/2015 (Approximate) Vital signs in last 24 hours: Temp:  [98.2 F (36.8 C)-98.8 F (37.1 C)] 98.2 F (36.8 C) (01/26 0823) Pulse Rate:  [64-124] 67 (01/26 1200) Resp:  [14-32] 19 (01/26 0945) BP: (137-243)/(78-126) 137/78 mmHg (01/26 1200) SpO2:  [90 %-98 %] 96 % (01/26 0945)  Intake/Output from previous day: 01/25 0701 - 01/26 0700 In: 600 [P.O.:600] Out: -  Intake/Output this shift:   Nutritional status: Diet Heart Room service appropriate?: Yes; Fluid consistency:: Thin  Neurologic Exam: Mental Status: Alert, oriented, thought content appropriate. Speech fluent without evidence of aphasia. Able to follow 3 step commands without difficulty. Cranial Nerves: II: Discs flat bilaterally; Visual fields grossly normal, pupils equal, round, reactive to light and accommodation III,IV, VI: ptosis not present, extra-ocular motions intact bilaterally V,VII: smile symmetric, facial light touch sensation decreased on the left VIII: hearing normal bilaterally IX,X: gag reflex present XI: bilateral shoulder shrug XII: midline tongue extension Motor: Right :Upper extremity 5/5Left: Upper extremity 3/5 Lower extremity 5/5Lower extremity 3/5 Tone and bulk:normal tone throughout; no atrophy noted Sensory: Pinprick and light touch decreased on the left Deep Tendon Reflexes: Slightly increased on the left Plantars: Right: downgoingLeft: downgoing Cerebellar: Normal finger-to-nose and normal heel-to-shin testing on the right. Unable to perform on the left due to weakness Gait: not tested due to safety  concerns  Lab Results: Basic Metabolic Panel:  Recent Labs Lab 09/27/15 0058 09/29/15 0554  NA 139 140  K 3.4* 3.8  CL 106 106  CO2 28 27  GLUCOSE 101* 98  BUN 20 18  CREATININE 0.89 0.96  CALCIUM 8.9 8.9    Liver Function Tests:  Recent Labs Lab 09/27/15 0058  AST 21  ALT 16  ALKPHOS 64  BILITOT 0.5  PROT 7.7  ALBUMIN 4.0   No results for input(s): LIPASE, AMYLASE in the last 168 hours. No results for input(s): AMMONIA in the last 168 hours.  CBC:  Recent Labs Lab 09/27/15 0058 09/29/15 0554  WBC 4.7 5.2  NEUTROABS 2.6  --   HGB 10.7* 10.2*  HCT 32.9* 31.5*  MCV 82.7 82.6  PLT 145* 151    Cardiac Enzymes:  Recent Labs Lab 09/27/15 0058  TROPONINI <0.03    Lipid Panel:  Recent Labs Lab 09/27/15 0058  CHOL 196  TRIG 60  HDL 50  CHOLHDL 3.9  VLDL 12  LDLCALC 098*    CBG:  Recent Labs Lab 09/27/15 0517  GLUCAP 96    Microbiology: Results for orders placed or performed during the hospital encounter of 09/27/15  MRSA PCR Screening     Status: None   Collection Time: 09/27/15  5:12 AM  Result Value Ref Range Status   MRSA by PCR NEGATIVE NEGATIVE Final    Comment:        The GeneXpert MRSA Assay (FDA approved for NASAL specimens only), is one component of a comprehensive MRSA colonization surveillance program. It is not intended to diagnose MRSA infection nor to guide or monitor treatment for MRSA infections.     Coagulation Studies: No results for input(s): LABPROT, INR in the last 72 hours.  Imaging: Ct Angio Neck W/cm &/or Wo/cm  09/28/2015  CLINICAL DATA:  Follow-up  examination for stroke, left-sided weakness, abnormal intracranial MRA. EXAM: CT ANGIOGRAPHY NECK TECHNIQUE: Multidetector CT imaging of the neck was performed using the standard protocol during bolus administration of intravenous contrast. Multiplanar CT image reconstructions and MIPs were obtained to evaluate the vascular anatomy. Carotid stenosis  measurements (when applicable) are obtained utilizing NASCET criteria, using the distal internal carotid diameter as the denominator. CONTRAST:  OMNIPAQUE IOHEXOL 350 MG/ML SOLN COMPARISON:  None. FINDINGS: Aortic arch: The visualized aortic arch is of normal caliber and appearance without significant atheromatous disease. No intraluminal thrombus or other abnormality. Note is made of a bovine arch with common origin of the right brachiocephalic and left common carotid artery. The visualized subclavian arteries are well opacified without stenosis or other abnormality. No significant irregularity seen about the visualized subclavian arteries. Right carotid system: Origin of the right common carotid artery widely patent. The proximal right common carotid artery is tortuous. Distally, there is circumferential wall thickening with mild intimal irregularity about the distal right common carotid artery (series 6, image 22). Wall thickening extends to the right carotid bifurcation without associated stenosis. Intimal irregularity with mild wall thickening about the proximal right ICA as well (series 6, image 76). There is a somewhat abrupt caliber change within the proximal ICA just distally without high-grade stenosis. Distally, the right ICA is mildly tortuous but widely patent to the circle of Willis. There is question of minimal intimal irregularity within the mid and distal right ICA, although this is certainly less apparent as compared to its proximal aspect. No dissection flap or flow limiting stenosis. These changes appear to largely spare the right external carotid artery and its branches. Left carotid system: Left common carotid artery widely patent at its origin. The left common carotid artery is tortuous proximally. Similar to the distal right common carotid artery, there is subtle intimal irregularity with circumferential wall thickening about the distal left common carotid artery (series 6, image 88).  No flow limiting stenosis. Changes extend into the left carotid bifurcation and proximal left ICA. Similar to the right, there is somewhat abrupt narrowing of the proximal left ICA with mild tortuosity. Mild wall thickening with intimal irregularity seen within the proximal left ICA as well. Left ICA is widely patent to the circle of Willis without flow-limiting stenosis. Similar to the right side, these changes appear to largely spare the left external carotid artery and its branches. Vertebral arteries:Both vertebral arteries arise from the subclavian arteries. The right vertebral artery is slightly dominant. Vertebral arteries are widely patent to the skullbase without flow-limiting stenosis, dissection, or occlusion. No definite irregularity seen about the vertebral arteries as seen a in the carotid arteries system in the neck. However, the intracranial V4 segments demonstrate subtle multifocal irregularity. In addition, the partially visualized vertebrobasilar junction and proximal basilar artery are irregular in appearance with multi focal narrowing, similar did changes seen on previous MRA (series 6, image 6). Skeleton: No acute osseous abnormality. No worrisome lytic or blastic osseous lesions. Mild degenerative spondylolysis present at C5-6. Other neck: 7 mm nodule present in the right upper lobe (series 6, image 205). Visualized lungs are otherwise clear without acute process. Visualized superior mediastinum within normal limits. Heterogeneous nodule measuring up to 2.9 cm present within the right thyroid lobe. Few additional scattered subcentimeter nodules noted within the thyroid gland bilaterally. No pathologically enlarged lymph nodes identified within the neck. No acute soft tissue abnormality. IMPRESSION: 1. Mild intimal irregularity with subtle circumferential soft tissue thickening about the distal common  and proximal internal carotid arteries bilaterally. The appearance of these vessels is felt to  be abnormal. Primary differential considerations include underlying vasculitis, connective tissue disorder, or possibly age advanced atheromatous disease, although this is felt to be less likely. These changes do not have the typical appearance for FMD. 2. Relative sparing of the vertebral arteries in the neck, which are widely patent to the skullbase. However, the intracranial V4 segments demonstrate subtle irregularity, with more obvious irregularity and multifocal narrowing at the partially visualized vertebrobasilar junction and proximal basilar artery, similar to the changes seen on previous MRA. 3. Heterogeneous right thyroid nodule measuring up to 2.9 cm. Correlation with dedicated thyroid ultrasound recommended if not already performed. 4. 7 mm right upper lobe nodule, indeterminate. If the patient is at high risk for bronchogenic carcinoma, follow-up chest CT at 3-75months is recommended. If the patient is at low risk for bronchogenic carcinoma, follow-up chest CT at 6-12 months is recommended. This recommendation follows the consensus statement: Guidelines for Management of Small Pulmonary Nodules Detected on CT Scans: A Statement from the Fleischner Society as published in Radiology 2005; 237:395-400. Electronically Signed   By: Rise Mu M.D.   On: 09/28/2015 21:01   Mr Brain Wo Contrast  09/27/2015  CLINICAL DATA:  45 year old female with new onset left side weakness yesterday morning. No associated headache or speech changes. Initial encounter. EXAM: MRI HEAD WITHOUT CONTRAST MRA HEAD WITHOUT CONTRAST TECHNIQUE: Multiplanar, multiecho pulse sequences of the brain and surrounding structures were obtained without intravenous contrast. Angiographic images of the head were obtained using MRA technique without contrast. COMPARISON:  Hip CT without contrast 0120 hours today. FINDINGS: MRI HEAD FINDINGS Curvilinear restricted diffusion tracking from the posterior right corona radiata through to  the posterior right putamen (series 5, image 26, series 8, images 19 and 20). Associated T2 and FLAIR hyperintensity. No associated hemorrhage or mass effect. No other restricted diffusion. Major intracranial vascular flow voids are within normal limits. Outside of the acute findings there is scattered bilateral periventricular and central white matter T2 and FLAIR hyperintensity (series 9, image 18). No chronic cerebral blood products. No cortical encephalomalacia. 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 Aside from this in the acute findings the other deep gray matter nuclei are within normal limits. Brainstem and cerebellum within normal limits. Partially empty sella. No midline shift, mass effect, evidence of mass lesion, ventriculomegaly, extra-axial collection or acute intracranial hemorrhage. Cervicomedullary junction within normal limits. Negative visualized cervical spine. Visible internal auditory structures appear normal. Mastoids are clear. Mild to moderate paranasal sinus mucosal thickening. Orbit and scalp soft tissues are within normal limits. Visualized bone marrow signal is within normal limits. MRA HEAD FINDINGS Antegrade flow in the posterior circulation. Codominant distal vertebral arteries with no stenosis. Normal vertebrobasilar junction. Normal AICA origins. Tortuous basilar artery without stenosis. SCA and PCA origins are normal. Left posterior communicating artery is present, the right is diminutive or absent. Proximal PCA branches appear normal, but there is mild to moderate distal PCA irregularity, most apparent in the left P3 segments. Antegrade flow in both ICA siphons, but severe irregularity at both carotid termini worse on the right. High-grade stenosis in the distal supra clinoid ICAs with evidence of right ICA terminus small collaterals (series 20, image 9). Despite this, both MCA origins remain patent. The left ACA origin is patent, the right A1 segment is diminutive  or occluded. The anterior communicating artery is patent. Beyond their origins both MCA M1 segments are  mildly irregular. Both MCA bifurcations are patent. No MCA branch occlusion. No definite M2 or distal MCA branch irregularity. IMPRESSION: 1. Small acute lacunar type infarct tracking from the right posterior corona radiata to the right putamen. No hemorrhage or mass effect. 2. 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 3. 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 Additionally, there is bilateral distal PCA branch irregularity. 4. Top differential considerations include sequelae of CNS vasculitis, hypercoagulable states (antiphospholipid syndromes, sickle cell disease), connective tissue disorder (SLE), less likely age advanced atherosclerosis or thromboembolic disease (consider paradoxical emboli such as from PFO). Electronically Signed   By: Odessa Fleming M.D.   On: 09/27/2015 14:55   US Carotid Bilateral  09/27/2015  CLINICAL DATA:  TIA. EXAM: BILATERAL CAROTID DUPLEX ULTRASOUND TECHNIQUE: Wallace Cullens scale imaging, color Doppler and duplex ultrasound were performed of bilateral carotid and vertebral arteries in the neck. COMPARISON:  MRI 09/27/2015 FINDINGS: Criteria: Quantification of carotid stenosis is based on velocity parameters that correlate the residual internal carotid diameter with NASCET-based stenosis levels, using the diameter of the distal internal carotid lumen as the denominator for stenosis measurement. The following velocity measurements were obtained: RIGHT ICA:  78/42 cm/sec CCA:  91/19 cm/sec SYSTOLIC ICA/CCA RATIO:  0.9 DIASTOLIC ICA/CCA RATIO:  2.2 ECA:  91 cm/sec LEFT ICA:  88/31 cm/sec CCA:  108/31 cm/sec SYSTOLIC ICA/CCA RATIO:  0.8  DIASTOLIC ICA/CCA RATIO:  1.0 ECA:  83 cm/sec RIGHT CAROTID ARTERY: Mild right carotid bifurcation plaque. No flow limiting stenosis. RIGHT VERTEBRAL ARTERY:  Patent with antegrade flow. LEFT CAROTID ARTERY:  Mild left carotid bifurcation plaque. LEFT VERTEBRAL ARTERY:  Patent with antegrade flow. Incidental note is made of a 3.5 x 3.0 x 2.2 cm complex right thyroid lobe cystic mass. Dedicated thyroid ultrasound suggested for further evaluation . IMPRESSION: 1. Mild bilateral carotid bifurcation atherosclerotic plaque. No flow limiting stenosis. Degree of stenosis less than 50%. Vertebral arteries are patent antegrade flow . 2. Incidental note is made of a 3.5 x 3.0 x 2.2 cm complex right thyroid lobe cystic mass. Dedicated thyroid ultrasound suggest for further evaluation. Electronically Signed   By: Maisie Fus  Register   On: 09/27/2015 15:36   Mr Maxine Glenn Head/brain Wo Cm  09/27/2015  CLINICAL DATA:  45 year old female with new onset left side weakness yesterday morning. No associated headache or speech changes. Initial encounter. EXAM: MRI HEAD WITHOUT CONTRAST MRA HEAD WITHOUT CONTRAST TECHNIQUE: Multiplanar, multiecho pulse sequences of the brain and surrounding structures were obtained without intravenous contrast. Angiographic images of the head were obtained using MRA technique without contrast. COMPARISON:  Hip CT without contrast 0120 hours today. FINDINGS: MRI HEAD FINDINGS Curvilinear restricted diffusion tracking from the posterior right corona radiata through to the posterior right putamen (series 5, image 26, series 8, images 19 and 20). Associated T2 and FLAIR hyperintensity. No associated hemorrhage or mass effect. No other restricted diffusion. Major intracranial vascular flow voids are within normal limits. Outside of the acute findings there is scattered bilateral periventricular and central white matter T2 and FLAIR hyperintensity (series 9, image 18). No chronic cerebral blood products. No cortical  encephalomalacia. 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 Aside from this in the acute findings the other deep gray matter nuclei are within normal limits. Brainstem and cerebellum within normal limits.  Partially empty sella. No midline shift, mass effect, evidence of mass lesion, ventriculomegaly, extra-axial collection or acute intracranial hemorrhage. Cervicomedullary junction within normal limits. Negative visualized cervical spine. Visible internal auditory structures appear normal. Mastoids are clear. Mild to moderate paranasal sinus mucosal thickening. Orbit and scalp soft tissues are within normal limits. Visualized bone marrow signal is within normal limits. MRA HEAD FINDINGS Antegrade flow in the posterior circulation. Codominant distal vertebral arteries with no stenosis. Normal vertebrobasilar junction. Normal AICA origins. Tortuous basilar artery without stenosis. SCA and PCA origins are normal. Left posterior communicating artery is present, the right is diminutive or absent. Proximal PCA branches appear normal, but there is mild to moderate distal PCA irregularity, most apparent in the left P3 segments. Antegrade flow in both ICA siphons, but severe irregularity at both carotid termini worse on the right. High-grade stenosis in the distal supra clinoid ICAs with evidence of right ICA terminus small collaterals (series 20, image 9). Despite this, both MCA origins remain patent. The left ACA origin is patent, the right A1 segment is diminutive or occluded. The anterior communicating artery is patent. Beyond their origins both MCA M1 segments are mildly irregular. Both MCA bifurcations are patent. No MCA branch occlusion. No definite M2 or distal MCA branch irregularity. IMPRESSION: 1. Small acute lacunar type infarct tracking from the right posterior corona radiata to the right putamen. No hemorrhage or mass effect. 2. 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 3. 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 Additionally, there is bilateral distal PCA branch irregularity. 4. Top differential considerations include sequelae of CNS vasculitis, hypercoagulable states (antiphospholipid syndromes, sickle cell disease), connective tissue disorder (SLE), less likely age advanced atherosclerosis or thromboembolic disease (consider paradoxical emboli such as from PFO). Electronically Signed   By: Odessa Fleming M.D.   On: 09/27/2015 14:55    Medications:  I have reviewed the patient's current medications. Scheduled: . amLODipine  10 mg Oral Daily  . aspirin EC  81 mg Oral Daily  . atorvastatin  40 mg Oral q1800  . butamben-tetracaine-benzocaine      . cloNIDine  0.1 mg Oral TID  . clopidogrel  75 mg Oral Daily  . enoxaparin (LOVENOX) injection  40 mg Subcutaneous Q24H  . fentaNYL      . hydrALAZINE      . lidocaine      . lisinopril  10 mg Oral Daily  . metoprolol tartrate  50 mg Oral BID  . midazolam      . ondansetron      . pantoprazole  40 mg Oral Daily  . sodium chloride        Assessment/Plan: Patient unchanged.  Seen by vascular.  CTA performed in place of catheter angiogram and findings felt to be sufficient.  No high grade stenosis noted on this imaging although vascular irregularity is still apparent.  TEE showed no cardiac source of emboli and no evidence of ASD.    Recommendations: 1.  Patient may continue on ASA  daily 2.  Follow up with neurology.  Lab work to be followed up a that time and need for steroids to be determined.   3.  Continued management of risk factors.     LOS: 2 days   Thana Farr, MD Neurology [phone] 09/29/2015  1:06 PM

## 2015-09-29 NOTE — Discharge Summary (Signed)
[facility] [place] [facility] [place] - [facility]   PATIENT NAME: Stephanie Hawkins    MR#:  [id number]  DATE OF BIRTH:  1971-01-01  DATE OF ADMISSION:  09/27/2015 ADMITTING PHYSICIAN: Ihor Austin, MD  DATE OF DISCHARGE: 09/29/2015  PRIMARY CARE PHYSICIAN: No primary care provider on file.    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 Hypertension     HOSPITAL COURSE:   * CVA  Confirmed by MRI  ASA, Statin, PT eval.  Echo , carotid doppler. MRA showes stenosis and possible vasculitis.  Consulted neurology, vascular surgery and rheumatology.  Neurology also ordered hypercoagulability work up.   After Extensive discussion with neurologist and Rheumatologist- came to conclusion - Not to start steroids now.   As her stenosis are not major- Discharge with oral asa per neuro.   I spoke to Dr. Cristopher Peru - he agreed to see her next week in office.   Then decide further plan.   Pt need out pt PT per PT eval.  * Possible intracranial vasculitis  As per MRA report.  Ordered hypercoagulability work ups.  CT angio done, TEE without any abnormalities.  * Uncontroled HTn  IV hydralazine, metoprolol, Clonidine, added amlodipin.  After 24 hrs of permissive htn, increased dose of meds.   Now BP is stable on these meds, given prescriptions.     * Hyperlipidmeia  Statin.  DISCHARGE CONDITIONS:   Stable.  CONSULTS OBTAINED:  Treatment Team:  Kym Groom, MD Kandyce Rud., MD Annice Needy, MD Renford Dills, MD  DRUG ALLERGIES:  No Known Allergies  DISCHARGE MEDICATIONS:   Current Discharge Medication  List    START taking these medications   Details  amLODipine (NORVASC) 10 MG tablet Take 1 tablet (10 mg total) by mouth daily. Qty: 30 tablet, Refills: 1    aspirin EC 325 MG tablet Take 1 tablet (325 mg total) by mouth daily. Qty: 30 tablet, Refills: 1    atorvastatin (LIPITOR) 40 MG tablet Take 1 tablet (40 mg total) by mouth daily at 6 PM. Qty: 30 tablet, Refills: 1    cloNIDine (CATAPRES) 0.1 MG tablet Take 1 tablet (0.1 mg total) by mouth 3 (three) times daily. Qty: 60 tablet, Refills: 1    lisinopril (PRINIVIL,ZESTRIL) 10 MG tablet Take 1 tablet (10 mg total) by mouth daily. Qty: 30 tablet, Refills: 1    metoprolol (LOPRESSOR) 50 MG tablet Take 1 tablet (50 mg total) by mouth 2 (two) times daily. Qty: 30 tablet, Refills: 1         DISCHARGE INSTRUCTIONS:   Follow with neurologist office next week.  If you experience worsening of your admission symptoms, develop shortness of breath, life threatening emergency, suicidal or homicidal thoughts you must seek medical attention immediately by calling 911 or calling your MD immediately  if symptoms less severe.  You Must read complete instructions/literature along with all the possible adverse reactions/side effects for all the Medicines you take and that have been prescribed to you. Take any new Medicines after you have completely understood and accept all the possible adverse reactions/side effects.   Please note  You were cared for by a hospitalist during your hospital stay.  If you have any questions about your discharge medications or the care you received while you were in the hospital after you are discharged, you can call the unit and asked to speak with the hospitalist on call if the hospitalist that took care of you is not available. Once you are discharged, your primary care physician will handle any further medical issues. Please note that NO REFILLS for any discharge medications will be authorized once you are  discharged, as it is imperative that you return to your primary care physician (or establish a relationship with a primary care physician if you do not have one) for your aftercare needs so that they can reassess your need for medications and monitor your lab values.    Today   CHIEF COMPLAINT:   Chief Complaint  Patient presents with  . Extremity Weakness    HISTORY OF PRESENT ILLNESS:  Stephanie Hawkins  is a 45 y.o. female with a known history of hypertension presented to the emergency room with left lower extremity weakness since yesterday morning. Patient woke up yesterday morning was trying to go to the bathroom to brush her teeth. She felt weak in her left leg and also noticed some weakness in the left upper extremity. Patient says her left leg was more weaker than the left upper extremity when she first noticed it. No history of any slurred speech. No complaints of any tingling or numbness in any part of the body. Prior history of any stroke. Patient not taking blood pressure medication for the last 1 year. Does not follow up with any primary care physician for the last few years. When patient presented to the emergency room her blood pressure was high and systolic blood pressure was more than 210 mm of Hg and diastolic pressure was around 160 mm of Hg. Patient was given IV hydralazine in the emergency room for control of blood pressure. Initial CT scan of the head did not show any intracranial abnormality. No history of any headache dizziness or blurry vision. No history of any fall or any seizures.   VITAL SIGNS:  Blood pressure 129/67, pulse 75, temperature 98.8 F (37.1 C), temperature source Oral, resp. rate 20, height 5\' 7"  (1.702 m), weight 89.903 kg (198 lb 3.2 oz), last menstrual period 09/05/2015, SpO2 97 %.  I/O:   Intake/Output Summary (Last 24 hours) at 09/29/15 1354 Last data filed at 09/28/15 1700  Gross per 24 hour  Intake    240 ml  Output      0 ml  Net    240 ml     PHYSICAL EXAMINATION:   GENERAL: 45 y.o.-year-old patient lying in the bed with no acute distress.  EYES: Pupils equal, round, reactive to light and accommodation. No scleral icterus. Extraocular muscles intact.  HEENT: Head atraumatic, normocephalic. Oropharynx and nasopharynx clear.  NECK: Supple, no jugular venous distention. No thyroid enlargement, no tenderness.  LUNGS: Normal breath sounds bilaterally, no wheezing, rales,rhonchi or crepitation. No use of accessory muscles of respiration.  CARDIOVASCULAR: S1, S2 normal. No murmurs, rubs, or gallops.  ABDOMEN: Soft, nontender, nondistended. Bowel sounds present. No organomegaly or mass.  EXTREMITIES: No pedal edema, cyanosis, or clubbing.  NEUROLOGIC: Cranial nerves II through XII are intact. Muscle strength 5/5 in all extremities except in LL on feet 4/5.Marland Kitchen Sensation intact. Gait not checked.  PSYCHIATRIC: The patient is alert and oriented x 3.  SKIN: No obvious rash, lesion, or ulcer.    DATA REVIEW:   CBC  Recent  Labs Lab 09/29/15 0554  WBC 5.2  HGB 10.2*  HCT 31.5*  PLT 151    Chemistries   Recent Labs Lab 09/27/15 0058 09/29/15 0554  NA 139 140  K 3.4* 3.8  CL 106 106  CO2 28 27  GLUCOSE 101* 98  BUN 20 18  CREATININE 0.89 0.96  CALCIUM 8.9 8.9  AST 21  --   ALT 16  --   ALKPHOS 64  --   BILITOT 0.5  --     Cardiac Enzymes  Recent Labs Lab 09/27/15 0058  TROPONINI <0.03    Microbiology Results  Results for orders placed or performed during the hospital encounter of 09/27/15  MRSA PCR Screening     Status: None   Collection Time: 09/27/15  5:12 AM  Result Value Ref Range Status   MRSA by PCR NEGATIVE NEGATIVE Final    Comment:        The GeneXpert MRSA Assay (FDA approved for NASAL specimens only), is one component of a comprehensive MRSA colonization surveillance program. It is not intended to diagnose MRSA infection nor to guide or monitor treatment for MRSA  infections.     RADIOLOGY:  Ct Angio Neck W/cm &/or Wo/cm  09/28/2015  CLINICAL DATA:  Follow-up examination for stroke, left-sided weakness, abnormal intracranial MRA. EXAM: CT ANGIOGRAPHY NECK TECHNIQUE: Multidetector CT imaging of the neck was performed using the standard protocol during bolus administration of intravenous contrast. Multiplanar CT image reconstructions and MIPs were obtained to evaluate the vascular anatomy. Carotid stenosis measurements (when applicable) are obtained utilizing NASCET criteria, using the distal internal carotid diameter as the denominator. CONTRAST:  OMNIPAQUE IOHEXOL 350 MG/ML SOLN COMPARISON:  None. FINDINGS: Aortic arch: The visualized aortic arch is of normal caliber and appearance without significant atheromatous disease. No intraluminal thrombus or other abnormality. Note is made of a bovine arch with common origin of the right brachiocephalic and left common carotid artery. The visualized subclavian arteries are well opacified without stenosis or other abnormality. No significant irregularity seen about the visualized subclavian arteries. Right carotid system: Origin of the right common carotid artery widely patent. The proximal right common carotid artery is tortuous. Distally, there is circumferential wall thickening with mild intimal irregularity about the distal right common carotid artery (series 6, image 22). Wall thickening extends to the right carotid bifurcation without associated stenosis. Intimal irregularity with mild wall thickening about the proximal right ICA as well (series 6, image 76). There is a somewhat abrupt caliber change within the proximal ICA just distally without high-grade stenosis. Distally, the right ICA is mildly tortuous but widely patent to the circle of Willis. There is question of minimal intimal irregularity within the mid and distal right ICA, although this is certainly less apparent as compared to its proximal aspect. No  dissection flap or flow limiting stenosis. These changes appear to largely spare the right external carotid artery and its branches. Left carotid system: Left common carotid artery widely patent at its origin. The left common carotid artery is tortuous proximally. Similar to the distal right common carotid artery, there is subtle intimal irregularity with circumferential wall thickening about the distal left common carotid artery (series 6, image 88). No flow limiting stenosis. Changes extend into the left carotid bifurcation and proximal left ICA. Similar to the right, there is somewhat abrupt narrowing of the proximal left ICA with mild tortuosity. Mild wall thickening with intimal irregularity seen within the proximal left ICA as well. Left ICA is widely  patent to the circle of Willis without flow-limiting stenosis. Similar to the right side, these changes appear to largely spare the left external carotid artery and its branches. Vertebral arteries:Both vertebral arteries arise from the subclavian arteries. The right vertebral artery is slightly dominant. Vertebral arteries are widely patent to the skullbase without flow-limiting stenosis, dissection, or occlusion. No definite irregularity seen about the vertebral arteries as seen a in the carotid arteries system in the neck. However, the intracranial V4 segments demonstrate subtle multifocal irregularity. In addition, the partially visualized vertebrobasilar junction and proximal basilar artery are irregular in appearance with multi focal narrowing, similar did changes seen on previous MRA (series 6, image 6). Skeleton: No acute osseous abnormality. No worrisome lytic or blastic osseous lesions. Mild degenerative spondylolysis present at C5-6. Other neck: 7 mm nodule present in the right upper lobe (series 6, image 205). Visualized lungs are otherwise clear without acute process. Visualized superior mediastinum within normal limits. Heterogeneous nodule  measuring up to 2.9 cm present within the right thyroid lobe. Few additional scattered subcentimeter nodules noted within the thyroid gland bilaterally. No pathologically enlarged lymph nodes identified within the neck. No acute soft tissue abnormality. IMPRESSION: 1. Mild intimal irregularity with subtle circumferential soft tissue thickening about the distal common and proximal internal carotid arteries bilaterally. The appearance of these vessels is felt to be abnormal. Primary differential considerations include underlying vasculitis, connective tissue disorder, or possibly age advanced atheromatous disease, although this is felt to be less likely. These changes do not have the typical appearance for FMD. 2. Relative sparing of the vertebral arteries in the neck, which are widely patent to the skullbase. However, the intracranial V4 segments demonstrate subtle irregularity, with more obvious irregularity and multifocal narrowing at the partially visualized vertebrobasilar junction and proximal basilar artery, similar to the changes seen on previous MRA. 3. Heterogeneous right thyroid nodule measuring up to 2.9 cm. Correlation with dedicated thyroid ultrasound recommended if not already performed. 4. 7 mm right upper lobe nodule, indeterminate. If the patient is at high risk for bronchogenic carcinoma, follow-up chest CT at 3-56months is recommended. If the patient is at low risk for bronchogenic carcinoma, follow-up chest CT at 6-12 months is recommended. This recommendation follows the consensus statement: Guidelines for Management of Small Pulmonary Nodules Detected on CT Scans: A Statement from the Fleischner Society as published in Radiology 2005; 237:395-400. Electronically Signed   By: Rise Mu M.D.   On: 09/28/2015 21:01   Mr Brain Wo Contrast  09/27/2015  CLINICAL DATA:  45 year old female with new onset left side weakness yesterday morning. No associated headache or speech changes. Initial  encounter. EXAM: MRI HEAD WITHOUT CONTRAST MRA HEAD WITHOUT CONTRAST TECHNIQUE: Multiplanar, multiecho pulse sequences of the brain and surrounding structures were obtained without intravenous contrast. Angiographic images of the head were obtained using MRA technique without contrast. COMPARISON:  Hip CT without contrast 0120 hours today. FINDINGS: MRI HEAD FINDINGS Curvilinear restricted diffusion tracking from the posterior right corona radiata through to the posterior right putamen (series 5, image 26, series 8, images 19 and 20). Associated T2 and FLAIR hyperintensity. No associated hemorrhage or mass effect. No other restricted diffusion. Major intracranial vascular flow voids are within normal limits. Outside of the acute findings there is scattered bilateral periventricular and central white matter T2 and FLAIR hyperintensity (series 9, image 18). No chronic cerebral blood products. No cortical encephalomalacia. 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 Aside from this in the acute findings  the other deep gray matter nuclei are within normal limits. Brainstem and cerebellum within normal limits. Partially empty sella. No midline shift, mass effect, evidence of mass lesion, ventriculomegaly, extra-axial collection or acute intracranial hemorrhage. Cervicomedullary junction within normal limits. Negative visualized cervical spine. Visible internal auditory structures appear normal. Mastoids are clear. Mild to moderate paranasal sinus mucosal thickening. Orbit and scalp soft tissues are within normal limits. Visualized bone marrow signal is within normal limits. MRA HEAD FINDINGS Antegrade flow in the posterior circulation. Codominant distal vertebral arteries with no stenosis. Normal vertebrobasilar junction. Normal AICA origins. Tortuous basilar artery without stenosis. SCA and PCA origins are normal. Left posterior communicating artery is present, the right is diminutive or absent. Proximal PCA  branches appear normal, but there is mild to moderate distal PCA irregularity, most apparent in the left P3 segments. Antegrade flow in both ICA siphons, but severe irregularity at both carotid termini worse on the right. High-grade stenosis in the distal supra clinoid ICAs with evidence of right ICA terminus small collaterals (series 20, image 9). Despite this, both MCA origins remain patent. The left ACA origin is patent, the right A1 segment is diminutive or occluded. The anterior communicating artery is patent. Beyond their origins both MCA M1 segments are mildly irregular. Both MCA bifurcations are patent. No MCA branch occlusion. No definite M2 or distal MCA branch irregularity. IMPRESSION: 1. Small acute lacunar type infarct tracking from the right posterior corona radiata to the right putamen. No hemorrhage or mass effect. 2. 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 3. 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 Additionally, there is bilateral distal PCA branch irregularity. 4. Top differential considerations include sequelae of CNS vasculitis, hypercoagulable states (antiphospholipid syndromes, sickle cell disease), connective tissue disorder (SLE), less likely age advanced atherosclerosis or thromboembolic disease (consider paradoxical emboli such as from PFO). Electronically Signed   By: Odessa Fleming M.D.   On: 09/27/2015 14:55   US Carotid Bilateral  09/27/2015  CLINICAL DATA:  TIA. EXAM: BILATERAL CAROTID DUPLEX ULTRASOUND TECHNIQUE: Wallace Cullens scale imaging, color Doppler and duplex ultrasound were performed of bilateral carotid and vertebral arteries in the neck. COMPARISON:  MRI 09/27/2015 FINDINGS: Criteria: Quantification of carotid stenosis is  based on velocity parameters that correlate the residual internal carotid diameter with NASCET-based stenosis levels, using the diameter of the distal internal carotid lumen as the denominator for stenosis measurement. The following velocity measurements were obtained: RIGHT ICA:  78/42 cm/sec CCA:  91/19 cm/sec SYSTOLIC ICA/CCA RATIO:  0.9 DIASTOLIC ICA/CCA RATIO:  2.2 ECA:  91 cm/sec LEFT ICA:  88/31 cm/sec CCA:  108/31 cm/sec SYSTOLIC ICA/CCA RATIO:  0.8 DIASTOLIC ICA/CCA RATIO:  1.0 ECA:  83 cm/sec RIGHT CAROTID ARTERY: Mild right carotid bifurcation plaque. No flow limiting stenosis. RIGHT VERTEBRAL ARTERY:  Patent with antegrade flow. LEFT CAROTID ARTERY:  Mild left carotid bifurcation plaque. LEFT VERTEBRAL ARTERY:  Patent with antegrade flow. Incidental note is made of a 3.5 x 3.0 x 2.2 cm complex right thyroid lobe cystic mass. Dedicated thyroid ultrasound suggested for further evaluation . IMPRESSION: 1. Mild bilateral carotid bifurcation atherosclerotic plaque. No flow limiting stenosis. Degree of stenosis less than 50%. Vertebral arteries are patent antegrade flow . 2. Incidental note is made of a 3.5 x 3.0 x 2.2 cm complex right thyroid lobe cystic  mass. Dedicated thyroid ultrasound suggest for further evaluation. Electronically Signed   By: Maisie Fus  Register   On: 09/27/2015 15:36   Mr Maxine Glenn Head/brain Wo Cm  09/27/2015  CLINICAL DATA:  45 year old female with new onset left side weakness yesterday morning. No associated headache or speech changes. Initial encounter. EXAM: MRI HEAD WITHOUT CONTRAST MRA HEAD WITHOUT CONTRAST TECHNIQUE: Multiplanar, multiecho pulse sequences of the brain and surrounding structures were obtained without intravenous contrast. Angiographic images of the head were obtained using MRA technique without contrast. COMPARISON:  Hip CT without contrast 0120 hours today. FINDINGS: MRI HEAD FINDINGS Curvilinear restricted diffusion tracking from the posterior right corona radiata  through to the posterior right putamen (series 5, image 26, series 8, images 19 and 20). Associated T2 and FLAIR hyperintensity. No associated hemorrhage or mass effect. No other restricted diffusion. Major intracranial vascular flow voids are within normal limits. Outside of the acute findings there is scattered bilateral periventricular and central white matter T2 and FLAIR hyperintensity (series 9, image 18). No chronic cerebral blood products. No cortical encephalomalacia. 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 Aside from this in the acute findings the other deep gray matter nuclei are within normal limits. Brainstem and cerebellum within normal limits. Partially empty sella. No midline shift, mass effect, evidence of mass lesion, ventriculomegaly, extra-axial collection or acute intracranial hemorrhage. Cervicomedullary junction within normal limits. Negative visualized cervical spine. Visible internal auditory structures appear normal. Mastoids are clear. Mild to moderate paranasal sinus mucosal thickening. Orbit and scalp soft tissues are within normal limits. Visualized bone marrow signal is within normal limits. MRA HEAD FINDINGS Antegrade flow in the posterior circulation. Codominant distal vertebral arteries with no stenosis. Normal vertebrobasilar junction. Normal AICA origins. Tortuous basilar artery without stenosis. SCA and PCA origins are normal. Left posterior communicating artery is present, the right is diminutive or absent. Proximal PCA branches appear normal, but there is mild to moderate distal PCA irregularity, most apparent in the left P3 segments. Antegrade flow in both ICA siphons, but severe irregularity at both carotid termini worse on the right. High-grade stenosis in the distal supra clinoid ICAs with evidence of right ICA terminus small collaterals (series 20, image 9). Despite this, both MCA origins remain patent. The left ACA origin is patent, the right A1 segment is  diminutive or occluded. The anterior communicating artery is patent. Beyond their origins both MCA M1 segments are mildly irregular. Both MCA bifurcations are patent. No MCA branch occlusion. No definite M2 or distal MCA branch irregularity. IMPRESSION: 1. Small acute lacunar type infarct tracking from the right posterior corona radiata to the right putamen. No hemorrhage or mass effect. 2. 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 3. 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 Additionally, there is bilateral distal PCA branch irregularity. 4. Top differential considerations include sequelae of CNS vasculitis, hypercoagulable states (antiphospholipid syndromes, sickle cell disease), connective tissue disorder (SLE), less likely age advanced atherosclerosis or thromboembolic disease (consider paradoxical emboli such as from PFO). Electronically Signed   By: Odessa Fleming M.D.   On: 09/27/2015 14:55     Management plans discussed with the patient, family and they are in agreement.  CODE STATUS:     Code Status Orders        Start  Ordered   09/27/15 0514  Full code   Continuous     09/27/15 0513    Code Status History    Date Active Date Inactive Code Status Order ID Comments User Context   This patient has a current code status but no historical code status.      TOTAL TIME TAKING CARE OF THIS PATIENT: 35 minutes.    Altamese Dilling M.D on 09/29/2015 at 1:54 PM  Between 7am to 6pm - Pager - [phone]  After 6pm go to [email or web address] - password [email or web address]  [facility]  Office  [phone]  CC: Primary care physician; No primary care provider on file.   Note: This dictation was prepared with Dragon  dictation along with smaller phrase technology. Any transcriptional errors that result from this process are unintentional.

## 2015-09-29 NOTE — Progress Notes (Signed)
PT Cancellation Note  Patient Details Name: Stephanie Hawkins MRN: [id number] DOB: [birth year]   Cancelled Treatment:    Reason Eval/Treat Not Completed: Medical issues which prohibited therapy. Chart reviewed. Last recorded BP is 203/116 which is contraindicated for PT at this time. Will continue to follow and attempt PT treatment when medically appropriate.  Sharalyn Ink Huprich PT, DPT   Huprich,Jason 09/29/2015, 8:43 AM

## 2015-09-29 NOTE — Discharge Instructions (Signed)
Follow with neurology clinic in 1 week.

## 2015-09-30 ENCOUNTER — Telehealth (HOSPITAL_COMMUNITY): Payer: Self-pay | Admitting: Interventional Radiology

## 2015-09-30 NOTE — Telephone Encounter (Signed)
Called pt, gave her instructions and appointment information for her cerebral angiogram scheduled for 10/04/15. Pt and husband both state understanding and are in agreement with this plan of care. JM

## 2015-10-03 ENCOUNTER — Other Ambulatory Visit: Payer: Self-pay | Admitting: General Surgery

## 2015-10-03 ENCOUNTER — Other Ambulatory Visit: Payer: Self-pay | Admitting: Radiology

## 2015-10-03 NOTE — Patient Instructions (Signed)
Phone call to pt at [phone]. Pt answered. Reviewed instructions for procedure with pt, pt verbalized understanding.

## 2015-10-04 ENCOUNTER — Encounter (HOSPITAL_COMMUNITY): Payer: Self-pay

## 2015-10-04 ENCOUNTER — Ambulatory Visit (HOSPITAL_COMMUNITY)
Admit: 2015-10-04 | Discharge: 2015-10-04 | Disposition: A | Discharge status: Home / Self Care | Payer: BC Managed Care – PPO | Source: Ambulatory Visit | Attending: Neurology | Admitting: Neurology

## 2015-10-04 ENCOUNTER — Other Ambulatory Visit: Payer: Self-pay | Admitting: Neurology

## 2015-10-04 ENCOUNTER — Ambulatory Visit (HOSPITAL_COMMUNITY)
Admission: RE | Admit: 2015-10-04 | Discharge: 2015-10-04 | Disposition: A | Discharge status: Home / Self Care | Payer: BC Managed Care – PPO | Source: Ambulatory Visit | Attending: Neurology | Admitting: Neurology

## 2015-10-04 DIAGNOSIS — I6523 Occlusion and stenosis of bilateral carotid arteries: Secondary | ICD-10-CM | POA: Insufficient documentation

## 2015-10-04 DIAGNOSIS — M50322 Other cervical disc degeneration at C5-C6 level: Secondary | ICD-10-CM | POA: Insufficient documentation

## 2015-10-04 DIAGNOSIS — E041 Nontoxic single thyroid nodule: Secondary | ICD-10-CM | POA: Diagnosis not present

## 2015-10-04 DIAGNOSIS — G459 Transient cerebral ischemic attack, unspecified: Secondary | ICD-10-CM

## 2015-10-04 DIAGNOSIS — Z8249 Family history of ischemic heart disease and other diseases of the circulatory system: Secondary | ICD-10-CM | POA: Diagnosis not present

## 2015-10-04 DIAGNOSIS — I10 Essential (primary) hypertension: Secondary | ICD-10-CM | POA: Insufficient documentation

## 2015-10-04 DIAGNOSIS — I6350 Cerebral infarction due to unspecified occlusion or stenosis of unspecified cerebral artery: Secondary | ICD-10-CM | POA: Insufficient documentation

## 2015-10-04 DIAGNOSIS — I639 Cerebral infarction, unspecified: Secondary | ICD-10-CM | POA: Diagnosis not present

## 2015-10-04 DIAGNOSIS — G450 Vertebro-basilar artery syndrome: Secondary | ICD-10-CM | POA: Diagnosis present

## 2015-10-04 DIAGNOSIS — Z7982 Long term (current) use of aspirin: Secondary | ICD-10-CM | POA: Diagnosis not present

## 2015-10-04 DIAGNOSIS — G8194 Hemiplegia, unspecified affecting left nondominant side: Secondary | ICD-10-CM | POA: Diagnosis not present

## 2015-10-04 LAB — PROTEIN C, TOTAL: PROTEIN C, TOTAL: 80 % (ref 60–150)

## 2015-10-04 LAB — PROTIME-INR
INR: 1.06 (ref 0.00–1.49)
Prothrombin Time: 14 seconds (ref 11.6–15.2)

## 2015-10-04 LAB — CARDIOLIPIN ANTIBODIES, IGG, IGM, IGA

## 2015-10-04 LAB — APTT: APTT: 31 s (ref 24–37)

## 2015-10-04 LAB — CBC
HEMATOCRIT: 30.7 % — AB (ref 36.0–46.0)
Hemoglobin: 10.3 g/dL — ABNORMAL LOW (ref 12.0–15.0)
MCH: 28.3 pg (ref 26.0–34.0)
MCHC: 33.6 g/dL (ref 30.0–36.0)
MCV: 84.3 fL (ref 78.0–100.0)
Platelets: 166 10*3/uL (ref 150–400)
RBC: 3.64 MIL/uL — ABNORMAL LOW (ref 3.87–5.11)
RDW: 12.9 % (ref 11.5–15.5)
WBC: 4.7 10*3/uL (ref 4.0–10.5)

## 2015-10-04 LAB — BASIC METABOLIC PANEL
Anion gap: 6 (ref 5–15)
BUN: 19 mg/dL (ref 6–20)
CHLORIDE: 105 mmol/L (ref 101–111)
CO2: 28 mmol/L (ref 22–32)
CREATININE: 0.96 mg/dL (ref 0.44–1.00)
Calcium: 9 mg/dL (ref 8.9–10.3)
GFR calc Af Amer: >60 mL/min (ref >60)
GFR calc non Af Amer: >60 mL/min (ref >60)
GLUCOSE: 99 mg/dL (ref 65–99)
POTASSIUM: 4.1 mmol/L (ref 3.5–5.1)
Sodium: 139 mmol/L (ref 135–145)

## 2015-10-04 LAB — PROTEIN S, TOTAL: Protein S Ag, Total: 127 % (ref 60–150)

## 2015-10-04 LAB — LUPUS ANTICOAGULANT PANEL
DRVVT: 32.2 s (ref 0.0–44.0)
PTT Lupus Anticoagulant: 32.6 s (ref 0.0–40.6)

## 2015-10-04 LAB — FACTOR 5 LEIDEN

## 2015-10-04 LAB — HOMOCYSTEINE

## 2015-10-04 MED ORDER — LIDOCAINE HCL 1 % IJ SOLN
INTRAMUSCULAR | Status: AC
  Filled 2015-10-04: qty 20

## 2015-10-04 MED ORDER — IOHEXOL 300 MG/ML  SOLN
150.0000 mL | Freq: Once | INTRAMUSCULAR | Status: AC | PRN
  Administered 2015-10-04: 80 mL via INTRAVENOUS

## 2015-10-04 MED ORDER — HYDRALAZINE HCL 20 MG/ML IJ SOLN
INTRAMUSCULAR | Status: AC | PRN
  Administered 2015-10-04 (×4): 5 mg via INTRAVENOUS

## 2015-10-04 MED ORDER — SODIUM CHLORIDE 0.9 % IV SOLN: INTRAVENOUS | Status: AC

## 2015-10-04 MED ORDER — IOHEXOL 300 MG/ML  SOLN
100.0000 mL | Freq: Once | INTRAMUSCULAR | Status: AC | PRN
  Administered 2015-10-04: 20 mL via INTRAVENOUS

## 2015-10-04 MED ORDER — HEPARIN SOD (PORK) LOCK FLUSH 100 UNIT/ML IV SOLN
INTRAVENOUS | Status: AC
  Filled 2015-10-04: qty 20

## 2015-10-04 MED ORDER — HEPARIN SOD (PORK) LOCK FLUSH 100 UNIT/ML IV SOLN
INTRAVENOUS | Status: AC | PRN
  Administered 2015-10-04 (×3): 500 [IU] via INTRAVENOUS

## 2015-10-04 MED ORDER — MIDAZOLAM HCL 2 MG/2ML IJ SOLN
INTRAMUSCULAR | Status: AC | PRN
  Administered 2015-10-04: 0.5 mg via INTRAVENOUS
  Administered 2015-10-04: 1 mg via INTRAVENOUS

## 2015-10-04 MED ORDER — FENTANYL CITRATE (PF) 100 MCG/2ML IJ SOLN
INTRAMUSCULAR | Status: AC | PRN
  Administered 2015-10-04: 25 ug via INTRAVENOUS
  Administered 2015-10-04: 12.5 ug via INTRAVENOUS
  Administered 2015-10-04: 25 ug via INTRAVENOUS

## 2015-10-04 MED ORDER — FENTANYL CITRATE (PF) 100 MCG/2ML IJ SOLN
INTRAMUSCULAR | Status: AC
  Filled 2015-10-04: qty 2

## 2015-10-04 MED ORDER — MIDAZOLAM HCL 2 MG/2ML IJ SOLN
INTRAMUSCULAR | Status: AC
  Filled 2015-10-04: qty 2

## 2015-10-04 MED ORDER — SODIUM CHLORIDE 0.9 % IV SOLN: Freq: Once | INTRAVENOUS | Status: DC

## 2015-10-04 MED ORDER — LABETALOL HCL 5 MG/ML IV SOLN
INTRAVENOUS | Status: AC | PRN
  Administered 2015-10-04 (×2): 5 mg via INTRAVENOUS

## 2015-10-04 MED ORDER — HYDRALAZINE HCL 20 MG/ML IJ SOLN
INTRAMUSCULAR | Status: AC
  Filled 2015-10-04: qty 1

## 2015-10-04 MED ORDER — LABETALOL HCL 5 MG/ML IV SOLN
INTRAVENOUS | Status: AC
  Filled 2015-10-04: qty 4

## 2015-10-04 NOTE — Sedation Documentation (Signed)
Patient is resting comfortably. Vitals stable. No complaints of pain at this time. 

## 2015-10-04 NOTE — Sedation Documentation (Signed)
Patient is resting comfortably. MD aware of blood pressure readings.

## 2015-10-04 NOTE — Sedation Documentation (Signed)
Patient is resting comfortably. Vitals stable, no complaints of pain 

## 2015-10-04 NOTE — Sedation Documentation (Signed)
Vital signs stable.  Denies pain at this time.

## 2015-10-04 NOTE — Sedation Documentation (Signed)
Report called to [place], [place].

## 2015-10-04 NOTE — Discharge Instructions (Signed)

## 2015-10-04 NOTE — Procedures (Signed)
S/P bilateral common carotid and bilateral vertebral artery angiograms. RT CFA approach. Findings . 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

## 2015-10-04 NOTE — Sedation Documentation (Signed)
Patient is resting comfortably. Tolerated procedure well 

## 2015-10-04 NOTE — H&P (Signed)
Chief Complaint: Patient was seen in consultation today for cerebral arteriogram at the request of Reynolds,Leslie  Referring Physician(s): Reynolds,Leslie  History of Present Illness: Stephanie Hawkins is a 45 y.o. female   Noted left sided numbness/weakness Improving since ASA 325 mg daily HTN (181/104)--has taken meds this am CVA 09/27/15 MRA: IMPRESSION: 1. Small acute lacunar type infarct tracking from the right posterior corona radiata to the right putamen. No hemorrhage or mass effect. 2. 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 3. 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 Additionally, there is bilateral distal PCA branch irregularity. 4. Top differential considerations include sequelae of CNS vasculitis, hypercoagulable states (antiphospholipid syndromes, sickle cell disease), connective tissue disorder (SLE), less likely age advanced atherosclerosis or thromboembolic disease (consider paradoxical emboli such as from PFO).  Scheduled for cerebral arteriogram for further evaluation  Past Medical History  Diagnosis Date  . Hypertension     Past Surgical History  Procedure Laterality Date  . Tubal ligation    . Tee without cardioversion N/A 09/29/2015    Procedure: Transesophageal Echocardiogram (Tee);  Surgeon: Laurier Nancy, MD;  Location: [facility] ORS;  Service: Cardiovascular;  Laterality: N/A;    Allergies: Review of patient's allergies indicates no known allergies.  Medications: Prior to Admission medications   Medication Sig Start Date End Date Taking? Authorizing Provider  amLODipine (NORVASC) 10 MG tablet Take 1 tablet (10 mg total) by mouth daily. 09/29/15   Altamese Dilling, MD  aspirin EC  325 MG tablet Take 1 tablet (325 mg total) by mouth daily. 09/29/15   Altamese Dilling, MD  atorvastatin (LIPITOR) 40 MG tablet Take 1 tablet (40 mg total) by mouth daily at 6 PM. 09/29/15   Altamese Dilling, MD  cloNIDine (CATAPRES) 0.1 MG tablet Take 1 tablet (0.1 mg total) by mouth 3 (three) times daily. 09/29/15   Altamese Dilling, MD  lisinopril (PRINIVIL,ZESTRIL) 10 MG tablet Take 1 tablet (10 mg total) by mouth daily. 09/29/15   Altamese Dilling, MD  metoprolol (LOPRESSOR) 50 MG tablet Take 1 tablet (50 mg total) by mouth 2 (two) times daily. 09/29/15   Altamese Dilling, MD     Family History  Problem Relation Age of Onset  . Heart failure Mother   . CVA Father     Social History   Social History  . Marital Status: Married    Spouse Name: N/A  . Number of Children: N/A  . Years of Education: N/A   Occupational History  . works in [employer] school system    Social History Main Topics  . Smoking status: Never Smoker   . Smokeless tobacco: None  . Alcohol Use: No  . Drug Use: No  . Sexual Activity: Not Asked   Other Topics Concern  . None   Social History Narrative   Lives with family    Review of Systems: A 12 point ROS discussed and pertinent positives are indicated in the HPI above.  All other systems are negative.  Review of Systems  Constitutional: Positive for activity change. Negative for fever, appetite change and fatigue.  Eyes: Negative for visual disturbance.  Respiratory: Negative for cough and shortness of breath.   Gastrointestinal: Negative for abdominal pain.  Musculoskeletal: Negative for back pain.  Neurological: Positive for weakness, light-headedness and numbness. Negative for dizziness,  tremors, seizures, syncope, facial asymmetry, speech difficulty and headaches.  Psychiatric/Behavioral: Negative for behavioral problems and confusion.    Vital Signs: Pulse 58  Temp(Src) 98.4 F (36.9 C) (Oral)  Resp 17  Ht 5\' 6"  (1.676  m)  Wt 198 lb (89.812 kg)  BMI 31.97 kg/m2  SpO2 100%  LMP 09/05/2015 (Approximate)  BP: 181/104  Physical Exam  Constitutional: She is oriented to person, place, and time. She appears well-nourished.  Eyes: EOM are normal.  Cardiovascular: Normal rate, regular rhythm and normal heart sounds.   Pulmonary/Chest: Effort normal and breath sounds normal. She has no wheezes.  Abdominal: Soft. Bowel sounds are normal. There is no tenderness.  Musculoskeletal: Normal range of motion.  Left side minimally weaker  Neurological: She is alert and oriented to person, place, and time.  Skin: Skin is warm and dry.  Psychiatric: She has a normal mood and affect. Her behavior is normal. Judgment and thought content normal.  Nursing note and vitals reviewed.   Mallampati Score:  MD Evaluation Airway: WNL Heart: WNL Abdomen: WNL Chest/ Lungs: WNL ASA  Classification: 2 Mallampati/Airway Score: Two  Imaging: Ct Head Wo Contrast  09/27/2015  CLINICAL DATA:  Heaviness in the left lower leg since the morning of 09/26/2015. No known injury. EXAM: CT HEAD WITHOUT CONTRAST TECHNIQUE: Contiguous axial images were obtained from the base of the skull through the vertex without intravenous contrast. COMPARISON:  None. FINDINGS: Examination is limited due to streak artifact. Ventricles and sulci appear symmetrical. No ventricular dilatation. There patchy low-attenuation changes in the deep white matter. These are nonspecific and could represent small vessel ischemic changes or other demyelinating process. No abnormal extra-axial fluid collections. Gray-white matter junctions are distinct. Basal cisterns are not effaced. No acute intracranial hemorrhage. Opacification of scattered ethmoid air cells. Paranasal sinuses and mastoid air cells are otherwise not opacified. Calvarium appears intact. IMPRESSION: Nonspecific changes in the white matter. No acute intracranial hemorrhage or mass effect. Electronically Signed    By: Burman Nieves M.D.   On: 09/27/2015 01:40   Ct Angio Neck W/cm &/or Wo/cm  09/28/2015  CLINICAL DATA:  Follow-up examination for stroke, left-sided weakness, abnormal intracranial MRA. EXAM: CT ANGIOGRAPHY NECK TECHNIQUE: Multidetector CT imaging of the neck was performed using the standard protocol during bolus administration of intravenous contrast. Multiplanar CT image reconstructions and MIPs were obtained to evaluate the vascular anatomy. Carotid stenosis measurements (when applicable) are obtained utilizing NASCET criteria, using the distal internal carotid diameter as the denominator. CONTRAST:  OMNIPAQUE IOHEXOL 350 MG/ML SOLN COMPARISON:  None. FINDINGS: Aortic arch: The visualized aortic arch is of normal caliber and appearance without significant atheromatous disease. No intraluminal thrombus or other abnormality. Note is made of a bovine arch with common origin of the right brachiocephalic and left common carotid artery. The visualized subclavian arteries are well opacified without stenosis or other abnormality. No significant irregularity seen about the visualized subclavian arteries. Right carotid system: Origin of the right common carotid artery widely patent. The proximal right common carotid artery is tortuous. Distally, there is circumferential wall thickening with mild intimal irregularity about the distal right common carotid artery (series 6, image 22). Wall thickening extends to the right carotid bifurcation without associated stenosis. Intimal irregularity with mild wall thickening about the proximal right ICA as well (series 6, image 76). There is a somewhat abrupt caliber change within the proximal ICA just distally without high-grade stenosis. Distally, the right ICA is mildly tortuous but widely patent to the  circle of Willis. There is question of minimal intimal irregularity within the mid and distal right ICA, although this is certainly less apparent as compared to its  proximal aspect. No dissection flap or flow limiting stenosis. These changes appear to largely spare the right external carotid artery and its branches. Left carotid system: Left common carotid artery widely patent at its origin. The left common carotid artery is tortuous proximally. Similar to the distal right common carotid artery, there is subtle intimal irregularity with circumferential wall thickening about the distal left common carotid artery (series 6, image 88). No flow limiting stenosis. Changes extend into the left carotid bifurcation and proximal left ICA. Similar to the right, there is somewhat abrupt narrowing of the proximal left ICA with mild tortuosity. Mild wall thickening with intimal irregularity seen within the proximal left ICA as well. Left ICA is widely patent to the circle of Willis without flow-limiting stenosis. Similar to the right side, these changes appear to largely spare the left external carotid artery and its branches. Vertebral arteries:Both vertebral arteries arise from the subclavian arteries. The right vertebral artery is slightly dominant. Vertebral arteries are widely patent to the skullbase without flow-limiting stenosis, dissection, or occlusion. No definite irregularity seen about the vertebral arteries as seen a in the carotid arteries system in the neck. However, the intracranial V4 segments demonstrate subtle multifocal irregularity. In addition, the partially visualized vertebrobasilar junction and proximal basilar artery are irregular in appearance with multi focal narrowing, similar did changes seen on previous MRA (series 6, image 6). Skeleton: No acute osseous abnormality. No worrisome lytic or blastic osseous lesions. Mild degenerative spondylolysis present at C5-6. Other neck: 7 mm nodule present in the right upper lobe (series 6, image 205). Visualized lungs are otherwise clear without acute process. Visualized superior mediastinum within normal limits.  Heterogeneous nodule measuring up to 2.9 cm present within the right thyroid lobe. Few additional scattered subcentimeter nodules noted within the thyroid gland bilaterally. No pathologically enlarged lymph nodes identified within the neck. No acute soft tissue abnormality. IMPRESSION: 1. Mild intimal irregularity with subtle circumferential soft tissue thickening about the distal common and proximal internal carotid arteries bilaterally. The appearance of these vessels is felt to be abnormal. Primary differential considerations include underlying vasculitis, connective tissue disorder, or possibly age advanced atheromatous disease, although this is felt to be less likely. These changes do not have the typical appearance for FMD. 2. Relative sparing of the vertebral arteries in the neck, which are widely patent to the skullbase. However, the intracranial V4 segments demonstrate subtle irregularity, with more obvious irregularity and multifocal narrowing at the partially visualized vertebrobasilar junction and proximal basilar artery, similar to the changes seen on previous MRA. 3. Heterogeneous right thyroid nodule measuring up to 2.9 cm. Correlation with dedicated thyroid ultrasound recommended if not already performed. 4. 7 mm right upper lobe nodule, indeterminate. If the patient is at high risk for bronchogenic carcinoma, follow-up chest CT at 3-55months is recommended. If the patient is at low risk for bronchogenic carcinoma, follow-up chest CT at 6-12 months is recommended. This recommendation follows the consensus statement: Guidelines for Management of Small Pulmonary Nodules Detected on CT Scans: A Statement from the Fleischner Society as published in Radiology 2005; 237:395-400. Electronically Signed   By: Rise Mu M.D.   On: 09/28/2015 21:01   Mr Brain Wo Contrast  09/27/2015  CLINICAL DATA:  45 year old female with new onset left side weakness yesterday morning. No associated headache or  speech changes.  Initial encounter. EXAM: MRI HEAD WITHOUT CONTRAST MRA HEAD WITHOUT CONTRAST TECHNIQUE: Multiplanar, multiecho pulse sequences of the brain and surrounding structures were obtained without intravenous contrast. Angiographic images of the head were obtained using MRA technique without contrast. COMPARISON:  Hip CT without contrast 0120 hours today. FINDINGS: MRI HEAD FINDINGS Curvilinear restricted diffusion tracking from the posterior right corona radiata through to the posterior right putamen (series 5, image 26, series 8, images 19 and 20). Associated T2 and FLAIR hyperintensity. No associated hemorrhage or mass effect. No other restricted diffusion. Major intracranial vascular flow voids are within normal limits. Outside of the acute findings there is scattered bilateral periventricular and central white matter T2 and FLAIR hyperintensity (series 9, image 18). No chronic cerebral blood products. No cortical encephalomalacia. 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 Aside from this in the acute findings the other deep gray matter nuclei are within normal limits. Brainstem and cerebellum within normal limits. Partially empty sella. No midline shift, mass effect, evidence of mass lesion, ventriculomegaly, extra-axial collection or acute intracranial hemorrhage. Cervicomedullary junction within normal limits. Negative visualized cervical spine. Visible internal auditory structures appear normal. Mastoids are clear. Mild to moderate paranasal sinus mucosal thickening. Orbit and scalp soft tissues are within normal limits. Visualized bone marrow signal is within normal limits. MRA HEAD FINDINGS Antegrade flow in the posterior circulation. Codominant distal vertebral arteries with no stenosis. Normal vertebrobasilar junction. Normal AICA origins. Tortuous basilar artery without stenosis. SCA and PCA origins are normal. Left posterior communicating artery is present, the right is diminutive  or absent. Proximal PCA branches appear normal, but there is mild to moderate distal PCA irregularity, most apparent in the left P3 segments. Antegrade flow in both ICA siphons, but severe irregularity at both carotid termini worse on the right. High-grade stenosis in the distal supra clinoid ICAs with evidence of right ICA terminus small collaterals (series 20, image 9). Despite this, both MCA origins remain patent. The left ACA origin is patent, the right A1 segment is diminutive or occluded. The anterior communicating artery is patent. Beyond their origins both MCA M1 segments are mildly irregular. Both MCA bifurcations are patent. No MCA branch occlusion. No definite M2 or distal MCA branch irregularity. IMPRESSION: 1. Small acute lacunar type infarct tracking from the right posterior corona radiata to the right putamen. No hemorrhage or mass effect. 2. 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 3. 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 Additionally, there is bilateral distal PCA branch irregularity. 4. Top differential considerations include sequelae of CNS vasculitis, hypercoagulable states (antiphospholipid syndromes, sickle cell disease), connective tissue disorder (SLE), less likely age advanced atherosclerosis or thromboembolic disease (consider paradoxical emboli such as from PFO). Electronically Signed   By: Odessa Fleming M.D.   On: 09/27/2015 14:55   US Carotid Bilateral  09/27/2015  CLINICAL DATA:  TIA. EXAM: BILATERAL CAROTID DUPLEX ULTRASOUND TECHNIQUE: Wallace Cullens scale imaging, color Doppler and duplex ultrasound were performed of bilateral carotid and vertebral arteries in the neck. COMPARISON:  MRI 09/27/2015 FINDINGS: Criteria: Quantification of  carotid stenosis is based on velocity parameters that correlate the residual internal carotid diameter with NASCET-based stenosis levels, using the diameter of the distal internal carotid lumen as the denominator for stenosis measurement. The following velocity measurements were obtained: RIGHT ICA:  78/42 cm/sec CCA:  91/19 cm/sec SYSTOLIC ICA/CCA RATIO:  0.9 DIASTOLIC ICA/CCA RATIO:  2.2 ECA:  91 cm/sec LEFT ICA:  88/31 cm/sec CCA:  108/31 cm/sec SYSTOLIC ICA/CCA RATIO:  0.8 DIASTOLIC ICA/CCA RATIO:  1.0 ECA:  83 cm/sec RIGHT CAROTID ARTERY: Mild right carotid bifurcation plaque. No flow limiting stenosis. RIGHT VERTEBRAL ARTERY:  Patent with antegrade flow. LEFT CAROTID ARTERY:  Mild left carotid bifurcation plaque. LEFT VERTEBRAL ARTERY:  Patent with antegrade flow. Incidental note is made of a 3.5 x 3.0 x 2.2 cm complex right thyroid lobe cystic mass. Dedicated thyroid ultrasound suggested for further evaluation . IMPRESSION: 1. Mild bilateral carotid bifurcation atherosclerotic plaque. No flow limiting stenosis. Degree of stenosis less than 50%. Vertebral arteries are patent antegrade flow . 2. Incidental note is made of a 3.5 x 3.0 x 2.2 cm complex right thyroid lobe cystic mass. Dedicated thyroid ultrasound suggest for further evaluation. Electronically Signed   By: Maisie Fus  Register   On: 09/27/2015 15:36   Dg Chest Port 1 View  09/27/2015  CLINICAL DATA:  Heaviness in the left lower leg. History of hypertension. EXAM: PORTABLE CHEST 1 VIEW COMPARISON:  None. FINDINGS: Slightly shallow inspiration. Cardiac enlargement without vascular congestion. No focal airspace disease or consolidation in the lungs. No blunting of costophrenic angles. No pneumothorax. Mediastinal contours appear intact. IMPRESSION: Mild cardiac enlargement.  No evidence of active pulmonary disease. Electronically Signed   By: Burman Nieves M.D.   On: 09/27/2015 01:24   Mr Maxine Glenn Head/brain Wo Cm  09/27/2015  CLINICAL DATA:   45 year old female with new onset left side weakness yesterday morning. No associated headache or speech changes. Initial encounter. EXAM: MRI HEAD WITHOUT CONTRAST MRA HEAD WITHOUT CONTRAST TECHNIQUE: Multiplanar, multiecho pulse sequences of the brain and surrounding structures were obtained without intravenous contrast. Angiographic images of the head were obtained using MRA technique without contrast. COMPARISON:  Hip CT without contrast 0120 hours today. FINDINGS: MRI HEAD FINDINGS Curvilinear restricted diffusion tracking from the posterior right corona radiata through to the posterior right putamen (series 5, image 26, series 8, images 19 and 20). Associated T2 and FLAIR hyperintensity. No associated hemorrhage or mass effect. No other restricted diffusion. Major intracranial vascular flow voids are within normal limits. Outside of the acute findings there is scattered bilateral periventricular and central white matter T2 and FLAIR hyperintensity (series 9, image 18). No chronic cerebral blood products. No cortical encephalomalacia. 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 Aside from this in the acute findings the other deep gray matter nuclei are within normal limits. Brainstem and cerebellum within normal limits. Partially empty sella. No midline shift, mass effect, evidence of mass lesion, ventriculomegaly, extra-axial collection or acute intracranial hemorrhage. Cervicomedullary junction within normal limits. Negative visualized cervical spine. Visible internal auditory structures appear normal. Mastoids are clear. Mild to moderate paranasal sinus mucosal thickening. Orbit and scalp soft tissues are within normal limits. Visualized bone marrow signal is within normal limits. MRA HEAD FINDINGS Antegrade flow in the posterior circulation. Codominant distal vertebral arteries with no stenosis. Normal vertebrobasilar junction. Normal AICA origins. Tortuous basilar artery without stenosis. SCA  and PCA origins are normal. Left posterior communicating artery is present, the right is diminutive or absent. Proximal PCA branches appear normal, but there is mild to moderate distal PCA irregularity, most apparent in the left P3 segments. Antegrade flow in both ICA siphons, but severe irregularity at both carotid termini worse on the right. High-grade stenosis in the distal supra  clinoid ICAs with evidence of right ICA terminus small collaterals (series 20, image 9). Despite this, both MCA origins remain patent. The left ACA origin is patent, the right A1 segment is diminutive or occluded. The anterior communicating artery is patent. Beyond their origins both MCA M1 segments are mildly irregular. Both MCA bifurcations are patent. No MCA branch occlusion. No definite M2 or distal MCA branch irregularity. IMPRESSION: 1. Small acute lacunar type infarct tracking from the right posterior corona radiata to the right putamen. No hemorrhage or mass effect. 2. 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 3. 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 Additionally, there is bilateral distal PCA branch irregularity. 4. Top differential considerations include sequelae of CNS vasculitis, hypercoagulable states (antiphospholipid syndromes, sickle cell disease), connective tissue disorder (SLE), less likely age advanced atherosclerosis or thromboembolic disease (consider paradoxical emboli such as from PFO). Electronically Signed   By: Odessa Fleming M.D.   On: 09/27/2015 14:55    Labs:  CBC:  Recent Labs  09/27/15 0058 09/29/15 0554  WBC 4.7 5.2  HGB 10.7* 10.2*  HCT 32.9* 31.5*  PLT 145* 151    COAGS: No results for input(s): INR, APTT in the last 8760  hours.  BMP:  Recent Labs  09/27/15 0058 09/29/15 0554  NA 139 140  K 3.4* 3.8  CL 106 106  CO2 28 27  GLUCOSE 101* 98  BUN 20 18  CALCIUM 8.9 8.9  CREATININE 0.89 0.96  GFRNONAA >60 >60  GFRAA >60 >60    LIVER FUNCTION TESTS:  Recent Labs  09/27/15 0058  BILITOT 0.5  AST 21  ALT 16  ALKPHOS 64  PROT 7.7  ALBUMIN 4.0    TUMOR MARKERS: No results for input(s): AFPTM, CEA, CA199, CHROMGRNA in the last 8760 hours.  Assessment and Plan:  HTN CVA 09/27/15 Left sided weakness remains but improving MRA/CTA reveal possible vasculitis Scheduled for cerebral arteriogram today per Dr Thad Ranger Risks and Benefits discussed with the patient including, but not limited to bleeding, infection, vascular injury, contrast induced renal failure, stroke or even death. All of the patient's questions were answered, patient is agreeable to proceed. Consent signed and in chart.  Thank you for this interesting consult.  I greatly enjoyed meeting Stephanie Hawkins and look forward to participating in their care.  A copy of this report was sent to the requesting provider on this date.  Electronically Signed: Jiles Goya A 10/04/2015, 8:01 AM   I spent a total of  30 Minutes   in face to face in clinical consultation, greater than 50% of which was counseling/coordinating care for cerebral arteriogram

## 2015-10-04 NOTE — Sedation Documentation (Signed)
Pt is resting well at this time. Vital signs stable.

## 2015-10-04 NOTE — Sedation Documentation (Signed)
Patient is resting comfortably. Vitals stable. 

## 2015-10-04 NOTE — Sedation Documentation (Signed)
Patient is resting comfortably. 

## 2015-10-04 NOTE — Sedation Documentation (Signed)
Vital signs stable. Denies pain. 

## 2015-10-04 NOTE — Sedation Documentation (Signed)
Patient is resting comfortably. Pt states she feels less anxious. VItals stable

## 2015-10-06 ENCOUNTER — Telehealth (HOSPITAL_COMMUNITY): Payer: Self-pay

## 2015-10-06 NOTE — Telephone Encounter (Signed)
Called to schedule f/u appt, left pt a message to call back. AW

## 2015-10-10 ENCOUNTER — Other Ambulatory Visit (HOSPITAL_COMMUNITY): Payer: Self-pay | Admitting: Interventional Radiology

## 2015-10-10 DIAGNOSIS — G459 Transient cerebral ischemic attack, unspecified: Secondary | ICD-10-CM

## 2015-10-12 ENCOUNTER — Encounter: Payer: Self-pay | Admitting: Occupational Therapy

## 2015-10-12 ENCOUNTER — Ambulatory Visit: Payer: BC Managed Care – PPO | Attending: Neurology | Admitting: Occupational Therapy

## 2015-10-12 DIAGNOSIS — Z741 Need for assistance with personal care: Secondary | ICD-10-CM

## 2015-10-12 DIAGNOSIS — R2689 Other abnormalities of gait and mobility: Secondary | ICD-10-CM | POA: Insufficient documentation

## 2015-10-12 DIAGNOSIS — R460 Very low level of personal hygiene: Secondary | ICD-10-CM | POA: Insufficient documentation

## 2015-10-12 DIAGNOSIS — M6281 Muscle weakness (generalized): Secondary | ICD-10-CM | POA: Diagnosis present

## 2015-10-12 DIAGNOSIS — R279 Unspecified lack of coordination: Secondary | ICD-10-CM | POA: Insufficient documentation

## 2015-10-12 DIAGNOSIS — I6980 Unspecified sequelae of other cerebrovascular disease: Secondary | ICD-10-CM | POA: Diagnosis present

## 2015-10-12 DIAGNOSIS — R531 Weakness: Secondary | ICD-10-CM | POA: Insufficient documentation

## 2015-10-12 DIAGNOSIS — IMO0002 Reserved for concepts with insufficient information to code with codable children: Secondary | ICD-10-CM

## 2015-10-12 DIAGNOSIS — M6289 Other specified disorders of muscle: Secondary | ICD-10-CM | POA: Diagnosis present

## 2015-10-14 NOTE — Therapy (Signed)
[place] [facility] MAIN [facility] SERVICES [address] [place], [place], [address] Phone: [phone]   Fax:  [phone]  Occupational Therapy Evaluation  Patient Details  Name: Stephanie Hawkins MRN: [id number] Date of Birth: Jun 15, 1971 Referring Provider: Sherryll Burger  Encounter Date: 10/12/2015      OT End of Session - 10/14/15 1105    Visit Number 1   Number of Visits 16   Date for OT Re-Evaluation 12/07/15   OT Start Time 1030   OT Stop Time 1129   OT Time Calculation (min) 59 min   Activity Tolerance Patient tolerated treatment well   Behavior During Therapy [facility] for tasks assessed/performed      Past Medical History  Diagnosis Date  . Hypertension     Past Surgical History  Procedure Laterality Date  . Tubal ligation    . Tee without cardioversion N/A 09/29/2015    Procedure: Transesophageal Echocardiogram (Tee);  Surgeon: Laurier Nancy, MD;  Location: [facility] ORS;  Service: Cardiovascular;  Laterality: N/A;    There were no vitals filed for this visit.  Visit Diagnosis:  Muscle weakness (generalized) - Plan: Ot plan of care cert/re-cert  Lack of coordination due to stroke - Plan: Ot plan of care cert/re-cert  Self-care deficit for dressing and grooming - Plan: Ot plan of care cert/re-cert      Subjective Assessment - 10/13/15 1051    Subjective  Patient reports she was at home and got up in the middle of the night to go to the bathroom and almost fell, she attempted to shower and then went to the ER.  She had felt her foot dragging and leg felt heavy.     Pertinent History Stephanie Hawkins is a 45 y.o. female with a known history of hypertension admitted to [facility] on 09/27/2015 with left lower extremity weakness. Patient woke up that morning was trying to go to the bathroom to brush her teeth. She felt weak in her left leg and also noticed some weakness in the left upper extremity. Patient says her left leg was weaker than the left upper extremity when  she first noticed it. No history of any slurred speech. Patient had not been taking blood pressure medication for the last 1 year. Has not followed up with any primary care physician for the last few years. When patient presented to the emergency room her blood pressure was high and systolic blood pressure was more than 210 mm of Hg and diastolic pressure was around 160 mm of Hg. Initial CT scan of the head did not show any intracranial abnormality. No history of any headache dizziness or blurry vision. No history of any fall or any seizures. Pt reports full independence with community mobility prior to arrival. 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 Brain MRI showed small acute lacunar CVA R posterior corona radiata to R putamen as well as chronic L CVA. Since discharge she has not been able to perform higher level IADL tasks such as laundry, cooking, cleaning.  She attempted to perform laundry and had to sit to fold clothes. Her church family has been bringing in meals.    Patient Stated Goals Patient wants to be independent with all tasks and return to work as soon as possible.    Currently in Pain? Yes   Pain Score 1    Pain Location Arm   Pain Orientation Left   Pain Descriptors / Indicators  Aching   Pain Type Acute pain   Aggravating Factors  Pain occurs only when being touched otherwise 0/10.    Multiple Pain Sites No           [facility] OT Assessment - 10/13/15 1102    Assessment   Diagnosis CVA   Onset Date 09/25/14   Prior Therapy PT in acute care   Balance Screen   Has the patient fallen in the past 6 months No   Has the patient had a decrease in activity level because of a fear of falling?  No   Is the patient reluctant to leave their home because of a fear of falling?  No   Home  Environment   Family/patient expects to be discharged to: Private residence   Living Arrangements Spouse/significant other   Available Help at  Discharge Family   Type of Home House   Home Access Stairs   Home Layout Two level   Alternate Level Stairs - Number of Steps 17   Bathroom Shower/Tub Tub/Shower unit;Curtain   Database administrator - 2 wheels;Grab bars - tub/shower   Additional Comments decreased balance and functional gait with daily tasks.   Prior Function   Level of Independence Independent   Vocation Full time employment;Part time employment  Full time, academic coach, staff dev. coord, teach   ADL   Eating/Feeding Needs assist with cutting food   Grooming Minimal assistance  assist with hair care   Upper Body Bathing Independent   Lower Body Bathing Modified independent   Upper Body Dressing Increased time  using sports bras right now   Lower Body Dressing Increased time   Toilet Tranfer Modified independent   Toileting - Clothing Manipulation Modified independent   Tub/Shower Transfer Modified independent   IADL   Prior Level of Function Shopping independent   Shopping Needs to be accompanied on any shopping trip   Prior Level of Function Light Housekeeping independent   Light Housekeeping Needs help with all home maintenance tasks   Prior Level of Function Meal Prep independent   Meal Prep Needs to have meals prepared and served   Prior Level of Function International aid/development worker Relies on family or friends for transportation   Medication Management Takes responsibility if medication is prepared in advance in seperate dosage   Prior Level of Function Financial Management independent   Development worker, community financial matters independently (budgets, writes checks, pays rent, bills goes to bank), collects and keeps track of income   Mobility   Mobility Status Needs assist   Mobility Status Comments walk with walker at home and in the community   Vision - History   Baseline Vision No visual deficits   Additional Comments reports some difficulty  with adjusting to computer screen   Cognition   Overall Cognitive Status Within Functional Limits for tasks assessed   Sensation   Light Touch Appears Intact   Stereognosis Appears Intact   Hot/Cold Appears Intact   Proprioception Appears Intact   Coordination   Gross Motor Movements are Fluid and Coordinated No   Fine Motor Movements are Fluid and Coordinated No   Finger Nose Finger Test impaired on left UE   9 Hole Peg Test Right;Left   Right 9 Hole Peg Test 20   Left 9 Hole Peg Test 36   ROM / Strength   AROM / PROM / Strength AROM;Strength   AROM   Overall AROM  Within functional limits for tasks performed   Strength   Overall Strength Deficits   Overall Strength Comments Right UE 5/5, left 4/5 overall   Hand Function   Right Hand Grip (lbs) 66   Right Hand Lateral Pinch 15 lbs   Right Hand 3 Point Pinch 13 lbs   Left Hand Grip (lbs) 39   Left Hand Lateral Pinch 16 lbs   Left 3 point pinch 10 lbs   Sensation Exercises   Stereognosis intact                         OT Education - 10/13/15 1105    Education provided Yes   Education Details Role of OT   Person(s) Educated Patient   Methods Explanation   Comprehension Verbalized understanding             OT Long Term Goals - 10/13/15 1112    OT LONG TERM GOAL #1   Title Patient will improve LUE strength by 1 mm grade to demonstrate lifting of pot filled with water to be independent with cooking.   Baseline unable to lift pots at evaluation especially if filled.    Time 8   Period Weeks   Status New   OT LONG TERM GOAL #2   Title Patient will improve LUE grip and strength sufficient to carry full laundry basket of clothes safely to laundry room using BUE.     Baseline unable at evaluation.   Time 8   Period Weeks   Status New   OT LONG TERM GOAL #3   Title Patient will be able to carry light weight items up and down stairs with modified technique and good balance.    Baseline unable at  evaluation   Time 8   Period Weeks   Status New   OT LONG TERM GOAL #4   Title Patient will be able to stand up to 20 minutes to wash dishes and/or fold laundry.     Baseline has to sit to complete laundry folding.   Time 8   Period Weeks   Status New   OT LONG TERM GOAL #5   Title Patient will demonstrate typing of 3 paragraphs in 15 minutes with less than 5 errors.    Baseline slow to type   Time 8   Period Weeks   Status New   Long Term Additional Goals   Additional Long Term Goals Yes   OT LONG TERM GOAL #6   Title Patient will increase grip strength on L hand by 15 pounds to be able to open jars and containers.    Baseline right grip 66#, left 39# at eval   Time 8   Period Weeks   Status New               Plan - 10/14/15 1106    Clinical Impression Statement Stephanie Hawkins is a 45 y.o. female diagnosed with small acute lacunar CVA R posterior corona radiata to R putamen as well as chronic L CVA. She was hospitalized for 2 days and now seeking outpatient OT.  She lives at home with her husband and was independent with all tasks prior to CVA including working a full time and part time job.  She presents with decreased mobility and now using walker for functional mobility, muscle weakness, decreased coordination of LUE, decreased ability to perform basic self-care tasks, unable to perform higher level IADL tasks such as driving, laundry,  cooking, cleaning and has not been able to return to work.  She has made some improvements since her hospital admission and would benefit from skilled OT to maximize her safety and independence in daily tasks.    Pt will benefit from skilled therapeutic intervention in order to improve on the following deficits (Retired) Decreased endurance;Decreased activity tolerance;Decreased knowledge of use of DME;Decreased strength;Decreased balance;Decreased mobility;Pain;Decreased coordination;Impaired UE functional use   Rehab Potential Excellent   OT  Frequency 2x / week   OT Duration 8 weeks   OT Treatment/Interventions Self-care/ADL training;Therapeutic exercise;Functional Mobility Training;Patient/family education;Neuromuscular education;Manual Therapy;Balance training;Therapeutic exercises;DME and/or AE instruction;Therapeutic activities   Consulted and Agree with Plan of Care Patient        Problem List Patient Active Problem List   Diagnosis Date Noted  . 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 Hypertensive urgency 09/27/2015  . TIA (transient ischemic attack) 09/27/2015   Amy T Arne Cleveland, OTR/L, CLT  Lovett,Amy 10/14/2015, 2:14 PM  [place] [facility] MAIN [facility] SERVICES [address] [place], [place], [address] Phone: [phone]   Fax:  [phone]  Name: Stephanie Hawkins MRN: [id number] Date of Birth: March 06, 1971

## 2015-10-17 ENCOUNTER — Ambulatory Visit: Payer: BC Managed Care – PPO | Admitting: Occupational Therapy

## 2015-10-17 DIAGNOSIS — R460 Very low level of personal hygiene: Secondary | ICD-10-CM

## 2015-10-17 DIAGNOSIS — IMO0002 Reserved for concepts with insufficient information to code with codable children: Secondary | ICD-10-CM

## 2015-10-17 DIAGNOSIS — Z741 Need for assistance with personal care: Secondary | ICD-10-CM

## 2015-10-17 DIAGNOSIS — M6281 Muscle weakness (generalized): Secondary | ICD-10-CM

## 2015-10-18 ENCOUNTER — Other Ambulatory Visit: Payer: Self-pay | Admitting: Family Medicine

## 2015-10-18 DIAGNOSIS — Z1231 Encounter for screening mammogram for malignant neoplasm of breast: Secondary | ICD-10-CM

## 2015-10-18 NOTE — Therapy (Signed)
[place] [facility] MAIN [facility] SERVICES [address] [place], [place], [address] Phone: [phone]   Fax:  [phone]  Occupational Therapy Treatment  Patient Details  Name: Stephanie Hawkins MRN: [id number] Date of Birth: 1971-05-25 Referring Provider: Sherryll Burger  Encounter Date: 10/17/2015      OT End of Session - 10/17/15 1623    Visit Number 2   Number of Visits 16   Date for OT Re-Evaluation 12/07/15   OT Start Time 1015   OT Stop Time 1100   OT Time Calculation (min) 45 min   Activity Tolerance Patient tolerated treatment well   Behavior During Therapy [facility] for tasks assessed/performed      Past Medical History  Diagnosis Date  . Hypertension     Past Surgical History  Procedure Laterality Date  . Tubal ligation    . Tee without cardioversion N/A 09/29/2015    Procedure: Transesophageal Echocardiogram (Tee);  Surgeon: Laurier Nancy, MD;  Location: [facility] ORS;  Service: Cardiovascular;  Laterality: N/A;    There were no vitals filed for this visit.  Visit Diagnosis:  Muscle weakness (generalized)  Lack of coordination due to stroke  Self-care deficit for dressing and grooming      Subjective Assessment - 10/17/15 1621    Subjective  Patient reports she is really tired from the weekend, went Saturday with her husband to get a new vehicle in [place] then had church on Sunday and guests at home, "It took more out of me than I thought!"  She reports she did try to sweep the floor the other day and it went OK.     Patient Stated Goals Patient wants to be independent with all tasks and return to work as soon as possible.    Currently in Pain? No/denies   Pain Score 0-No pain                      OT Treatments/Exercises (OP) - 10/17/15 1643    Fine Motor Coordination   Other Fine Motor Exercises Patient seen for manipulation of one inch sized objects from tabletop, turning and flipping each from end of end, using the hand for  storage and translatory skills of the hand.     Other Fine Motor Exercises Manipulation of grooved pegs with cues to follow line by line and use isolated finger movements to complete task left hand use.     Neurological Re-education Exercises   Other Exercises 1 2# dowel for shoulder flexion, ABD, ADD, forwards/backwards circles, chest press and elbow flexion extension 10 reps for 1-2 sets each, rest breaks as needed.  Patient seen for LUE multidirectional reaching tasks, grip strength with 2nd setting for 25 reps, 3rd setting for 5 reps white spring and cues for sustained grip.     Sensation Exercises   Stereognosis intact                OT Education - 10/17/15 1623    Education provided Yes   Education Details HEP, strength and coordination tasks.    Person(s) Educated Patient   Methods Explanation;Demonstration;Verbal cues   Comprehension Verbal cues required;Returned demonstration;Verbalized understanding             OT Long Term Goals - 10/13/15 1112    OT LONG TERM GOAL #1   Title Patient will improve LUE strength by 1 mm grade to demonstrate lifting of pot filled with water to be independent with cooking.   Baseline unable  to lift pots at evaluation especially if filled.    Time 8   Period Weeks   Status New   OT LONG TERM GOAL #2   Title Patient will improve LUE grip and strength sufficient to carry full laundry basket of clothes safely to laundry room using BUE.     Baseline unable at evaluation.   Time 8   Period Weeks   Status New   OT LONG TERM GOAL #3   Title Patient will be able to carry light weight items up and down stairs with modified technique and good balance.    Baseline unable at evaluation   Time 8   Period Weeks   Status New   OT LONG TERM GOAL #4   Title Patient will be able to stand up to 20 minutes to wash dishes and/or fold laundry.     Baseline has to sit to complete laundry folding.   Time 8   Period Weeks   Status New   OT LONG  TERM GOAL #5   Title Patient will demonstrate typing of 3 paragraphs in 15 minutes with less than 5 errors.    Baseline slow to type   Time 8   Period Weeks   Status New   Long Term Additional Goals   Additional Long Term Goals Yes   OT LONG TERM GOAL #6   Title Patient will increase grip strength on L hand by 15 pounds to be able to open jars and containers.    Baseline right grip 66#, left 39# at eval   Time 8   Period Weeks   Status New               Plan - 10/17/15 1624    Clinical Impression Statement Patient seen for initial treatment session with focus on strength and coordination tasks to improve performance in daily acts at home.  She has not been participating in higher level ADL/IADL tasks and husband has been helping her more.  Will attempt to progress to activities in standing to work towards improved strength and endurance to perform tasks such as dishwashing, laundry, cleaning etc.  She did attempt to use the broom this weekend and felt she was able to hold it but had some difficulty with control.    Pt will benefit from skilled therapeutic intervention in order to improve on the following deficits (Retired) Decreased endurance;Decreased activity tolerance;Decreased knowledge of use of DME;Decreased strength;Decreased balance;Decreased mobility;Pain;Decreased coordination;Impaired UE functional use   Rehab Potential Excellent   OT Frequency 2x / week   OT Duration 8 weeks   OT Treatment/Interventions Self-care/ADL training;Therapeutic exercise;Functional Mobility Training;Patient/family education;Neuromuscular education;Manual Therapy;Balance training;Therapeutic exercises;DME and/or AE instruction;Therapeutic activities   Consulted and Agree with Plan of Care Patient        Problem List Patient Active Problem List   Diagnosis Date Noted  . 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 Hypertensive urgency 09/27/2015  . TIA  (transient ischemic attack) 09/27/2015   Amy T Lovett, OTR/L, CLT  Lovett,Amy 10/18/2015, 4:51 PM  [place] [facility] MAIN [facility] SERVICES [address] [place], [place], [address] Phone: [phone]   Fax:  [phone]  Name: Stephanie Hawkins MRN: [id number] Date of Birth: 1971/05/08

## 2015-10-19 ENCOUNTER — Ambulatory Visit: Payer: BC Managed Care – PPO

## 2015-10-19 ENCOUNTER — Ambulatory Visit: Payer: BC Managed Care – PPO | Admitting: Occupational Therapy

## 2015-10-19 DIAGNOSIS — M6281 Muscle weakness (generalized): Secondary | ICD-10-CM | POA: Diagnosis not present

## 2015-10-19 DIAGNOSIS — R279 Unspecified lack of coordination: Secondary | ICD-10-CM

## 2015-10-19 DIAGNOSIS — R2689 Other abnormalities of gait and mobility: Secondary | ICD-10-CM

## 2015-10-19 DIAGNOSIS — R531 Weakness: Secondary | ICD-10-CM

## 2015-10-19 NOTE — Therapy (Addendum)
[place] [facility] MAIN [facility] SERVICES [address] [place], [place], [address] Phone: [phone]   Fax:  [phone]  Physical Therapy Evaluation  Patient Details  Name: Stephanie Hawkins MRN: [id number] Date of Birth: 09-14-1970 Referring Provider: Dr Sherryll Burger  Encounter Date: 10/19/2015      PT End of Session - 10/19/15 1803    Visit Number 1   Number of Visits 9   Date for PT Re-Evaluation 11/16/15   PT Start Time 1652   PT Stop Time 1745   PT Time Calculation (min) 53 min   Equipment Utilized During Treatment Gait belt   Activity Tolerance Patient tolerated treatment well   Behavior During Therapy [facility] for tasks assessed/performed      Past Medical History  Diagnosis Date  . Hypertension   . Stroke [facility])     Past Surgical History  Procedure Laterality Date  . Tubal ligation    . Tee without cardioversion N/A 09/29/2015    Procedure: Transesophageal Echocardiogram (Tee);  Surgeon: Laurier Nancy, MD;  Location: [facility] ORS;  Service: Cardiovascular;  Laterality: N/A;    There were no vitals filed for this visit.  Visit Diagnosis:  Left-sided weakness - Plan: PT plan of care cert/re-cert  Imbalance - Plan: PT plan of care cert/re-cert      Subjective Assessment - 10/19/15 1702    Subjective pt states that she felt L sided weakness and experienced some dragging of L leg on January 23rd that got gradually worse and then eventually went to ER some time after. pt states she was diagnosed with stroke and sent to PT to increase strength and balance. pt did some therapy during short hospital stay.   Limitations Standing;Walking   How long can you stand comfortably? 10 min stand then needs rest   How long can you walk comfortably? 10 min   Patient Stated Goals walk without AD   Currently in Pain? No/denies            [facility] PT Assessment - 10/20/15 0001    Assessment   Medical Diagnosis stroke   Onset Date/Surgical Date 09/26/15   Next MD  Visit 11/01/15   Prior Therapy no   Precautions   Precautions Fall   Restrictions   Weight Bearing Restrictions No   Balance Screen   Has the patient fallen in the past 6 months No   Has the patient had a decrease in activity level because of a fear of falling?  Yes   Is the patient reluctant to leave their home because of a fear of falling?  Yes   Home Environment   Living Environment Private residence   Living Arrangements Spouse/significant other   Available Help at Discharge Family   Type of Home House   Home Access Level entry   Home Layout Two level   Alternate Level Stairs-Number of Steps 15   Alternate Level Stairs-Rails Left   Home Equipment Walker - 2 wheels   Prior Function   Level of Independence Independent   Vocation Full time employment   Standardized Balance Assessment   Standardized Balance Assessment Berg Balance Test   Berg Balance Test   Sit to Stand Able to stand without using hands and stabilize independently   Standing Unsupported Able to stand safely 2 minutes   Sitting with Back Unsupported but Feet Supported on Floor or Stool Able to sit safely and securely 2 minutes   Stand to Sit Controls descent by using hands  Transfers Able to transfer safely, minor use of hands   Standing Unsupported with Eyes Closed Able to stand 10 seconds safely   Standing Ubsupported with Feet Together Able to place feet together independently and stand for 1 minute with supervision   From Standing, Reach Forward with Outstretched Arm Can reach confidently >25 cm (10")   From Standing Position, Pick up Object from Floor Able to pick up shoe, needs supervision   From Standing Position, Turn to Look Behind Over each Shoulder Looks behind one side only/other side shows less weight shift   Turn 360 Degrees Able to turn 360 degrees safely but slowly   Standing Unsupported, Alternately Place Feet on Step/Stool Able to stand independently and complete 8 steps >20 seconds   Standing  Unsupported, One Foot in Front Able to place foot tandem independently and hold 30 seconds   Standing on One Leg Able to lift leg independently and hold > 10 seconds   Total Score 49       POSTURE/OBSERVATION: Pt has slight slumped posture in sitting  PROM/AROM:  WFL  STRENGTH:  Graded on a 0-5 scale Muscle Group  Left  Right    Shoulder Flex       Shoulder Abd       Shoulder Ex       Horizontal Abd       Horizontal Add       Elbow Flex      Elbow Ex    Wrist Flex    Wrist Ex    Hip Flex  4- 4+  Hip Abd  4- 4+  Hip Add  3+ 4  Hip Ext  4- 4-  Hip IR/ER       Knee Flex  4 4+  Knee Ext  4+ 5  Ankle DF  4 5  Ankle PF        SENSATION: Decreased sensation to L lower leg below the knee  SPECIAL TESTS: Elys (-)  BALANCE: Moderate fall risk. See berg  GAIT: pt has decreased L hip flexion and knee flexion with decreased DF and trendelenburg gait causing the R hip to drop during single leg support and the L foot to drag  OUTCOME MEASURES: TEST  Outcome  Interpretation   5 times sit<>stand  18.97 sec  >60 yo, >15 sec indicates increased risk for falls   10 meter walk test  0.61m/s w/o walker 0.61m/s w/ walker  <1.0 m/s indicates increased risk for falls; limited community ambulator   Timed up and Go                  sec  <14 sec indicates increased risk for falls   6 minute walk test                 Feet  1000 feet is community Licensed conveyancer            49/56 <36/56 (100% risk for falls), 37-45 (80% risk for falls); 46-51 (>50% risk for falls); 52-55 (lower risk <25% of falls)            PT Education - 10/19/15 1802    Education provided Yes   Education Details pt was educated in plan of care, outcome measure results and findings on exam   Person(s) Educated Patient   Methods Explanation   Comprehension Verbalized understanding             PT Long Term Goals - 10/19/15  1815    PT LONG TERM GOAL #1   Title pt will improve 5x STS  without UE use to <10 sec to demonstrate increased strength and decreased balance dysfunction   Baseline 18.97s   Time 4   Period Weeks   Status New   PT LONG TERM GOAL #2   Title pt will increase walk speed to 1.2 m/s without use of an AD to be a safe community ambulator   Baseline 0.85 m/s   Time 4   Period Weeks   Status New   PT LONG TERM GOAL #3   Title pt will score >51/56 on the Berg Balance test to demonstrate low fall risk   Baseline 49/56   Time 4   Period Weeks   Status New   PT LONG TERM GOAL #4   Title pt will be able to acend and decend 8 stairs in step over step pattern without AD use or railing, independently, in order to have access to second story of home   Time 4   Period Weeks   Status New               Plan - 10/19/15 1804    Clinical Impression Statement pt presents with L sided weakness post stroke effecting her strength, balance and gait. pt shows some dificulty in moving from sit to stand and transitional movements and uses a 2 wheeled walker for ambulation. pt has difficulty balancing when L LE is involved with SLS on left of 4 seconds and >10 on R. pt demonstrates L trendelenburg with R hip drop and decreased knee flexion and DF on the L causing some foot drag during swing through phase. Pt demonstrates moderate fall risk with Berg score. pt will benefit from skilled therapy in order to increase strength and balance to return to ADLs and improve gait for independent ambulation.   Pt will benefit from skilled therapeutic intervention in order to improve on the following deficits Abnormal gait;Decreased balance;Decreased endurance;Decreased strength;Difficulty walking;Impaired sensation   Rehab Potential Excellent   PT Frequency 2x / week   PT Duration 4 weeks   PT Treatment/Interventions ADLs/Self Care Home Management;Electrical Stimulation;Iontophoresis /ml Dexamethasone;Ultrasound;Gait training;Stair training;Functional mobility  training;Therapeutic exercise;Balance training;Neuromuscular re-education;Patient/family education;Manual techniques;Energy conservation   PT Next Visit Plan HEP   Consulted and Agree with Plan of Care Patient         Problem List Patient Active Problem List   Diagnosis Date Noted  . 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 Hypertensive urgency 09/27/2015  . TIA (transient ischemic attack) 09/27/2015   Estella Husk, SPT This entire session was performed under direct supervision and direction of a licensed therapist/therapist assistant . I have personally read, edited and approve of the note as written. Carlyon Shadow. Tortorici, PT, DPT [phone]   Tortorici,Ashley 10/20/2015, 8:53 AM  [facility] MAIN [facility] SERVICES [address] [place], [place], [address] Phone: [phone]   Fax:  [phone]  Name: Stephanie Hawkins MRN: [id number] Date of Birth: 06/20/1971

## 2015-10-20 NOTE — Therapy (Signed)
[place] [facility] MAIN [facility] SERVICES [address] [place], [place], [address] Phone: [phone]   Fax:  [phone]  Occupational Therapy Treatment  Patient Details  Name: Stephanie Hawkins MRN: [id number] Date of Birth: [birth year] Referring Provider: Sherryll Burger  Encounter Date: 10/19/2015      OT End of Session - 10/20/15 0855    Visit Number 3   Number of Visits 16   Date for OT Re-Evaluation 12/07/15   OT Start Time 1622   OT Stop Time 1650   OT Time Calculation (min) 28 min   Activity Tolerance Patient tolerated treatment well      Past Medical History  Diagnosis Date  . Hypertension   . Stroke [facility])     Past Surgical History  Procedure Laterality Date  . Tubal ligation    . Tee without cardioversion N/A 09/29/2015    Procedure: Transesophageal Echocardiogram (Tee);  Surgeon: Laurier Nancy, MD;  Location: [facility] ORS;  Service: Cardiovascular;  Laterality: N/A;    There were no vitals filed for this visit.  Visit Diagnosis:  Lack of coordination  Weakness      Subjective Assessment - 10/20/15 0838    Subjective  pt. reports she is doing well. Reports no changes since the last treament session.   Patient is accompained by: Family member   Pertinent History Stephanie Hawkins is a 45 y.o. female with a known history of hypertension admitted to [facility] on 09/27/2015 with left lower extremity weakness. Patient woke up that morning was trying to go to the bathroom to brush her teeth. She felt weak in her left leg and also noticed some weakness in the left upper extremity. Patient says her left leg was weaker than the left upper extremity when she first noticed it. No history of any slurred speech. Patient had not been taking blood pressure medication for the last 1 year. Has not followed up with any primary care physician for the last few years. When patient presented to the emergency room her blood pressure was high and systolic blood pressure was more than  210 mm of Hg and diastolic pressure was around 160 mm of Hg. Initial CT scan of the head did not show any intracranial abnormality. No history of any headache dizziness or blurry vision. No history of any fall or any seizures. Pt reports full independence with community mobility prior to arrival. 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 Brain MRI showed small acute lacunar CVA R posterior corona radiata to R putamen as well as chronic L CVA. Since discharge she has not been able to perform higher level IADL tasks such as laundry, cooking, cleaning.  She attempted to perform laundry and had to sit to fold clothes. Her church family has been bringing in meals.    Limitations Standing IADL tasks, UE strength and impaired coordination and hand function.   Patient Stated Goals Patient wants to be independent with all tasks and return to work as soon as possible.    Currently in Pain? No/denies   Pain Score 0-No pain                      OT Treatments/Exercises (OP) - 10/20/15 0845    Neurological Re-education Exercises   Other Exercises 1 Pt. performed 3# dowel ex. For UE strengthening in secondary to weakness. Bilateral shoulder flexion, chest press, circular patterns, and elbow flexion/extension were performed. 2#  dumbbell ex. for forearm supination/pronation, wrist flexion/extension, and radial deviation. Pt. requires rest breaks and verbal cues for proper technique   Other Exercises 2 Pt. performed gross gripping with grip strengthener. Pt. was able to sustain grip while grasping pegs and reaching at various heights. Performed in sitting and standing with more difficulty maintaining grip in standing.                 OT Education - 10/20/15 0855    Education provided Yes   Person(s) Educated Patient   Methods Explanation   Comprehension Verbalized understanding             OT Long Term Goals - 10/13/15 1112     OT LONG TERM GOAL #1   Title Patient will improve LUE strength by 1 mm grade to demonstrate lifting of pot filled with water to be independent with cooking.   Baseline unable to lift pots at evaluation especially if filled.    Time 8   Period Weeks   Status New   OT LONG TERM GOAL #2   Title Patient will improve LUE grip and strength sufficient to carry full laundry basket of clothes safely to laundry room using BUE.     Baseline unable at evaluation.   Time 8   Period Weeks   Status New   OT LONG TERM GOAL #3   Title Patient will be able to carry light weight items up and down stairs with modified technique and good balance.    Baseline unable at evaluation   Time 8   Period Weeks   Status New   OT LONG TERM GOAL #4   Title Patient will be able to stand up to 20 minutes to wash dishes and/or fold laundry.     Baseline has to sit to complete laundry folding.   Time 8   Period Weeks   Status New   OT LONG TERM GOAL #5   Title Patient will demonstrate typing of 3 paragraphs in 15 minutes with less than 5 errors.    Baseline slow to type   Time 8   Period Weeks   Status New   Long Term Additional Goals   Additional Long Term Goals Yes   OT LONG TERM GOAL #6   Title Patient will increase grip strength on L hand by 15 pounds to be able to open jars and containers.    Baseline right grip 66#, left 39# at eval   Time 8   Period Weeks   Status New               Plan - 10/20/15 1610    Clinical Impression Statement Pt. continues to focus on improving UE strength, and coordination for improved hand use during ADL and IADL tasks. Pt. continues to present with limited UE hand function, strength, and endurance with standing ADL/IADL tasks. Pt. requires ciontinued skilled OT services to address and improve her ability to perform  IADL tasks including laundry, dishwashing, and cleaning tasks in standing.   Rehab Potential Excellent   OT Frequency 2x / week   OT Duration 8 weeks    OT Treatment/Interventions Self-care/ADL training;Therapeutic exercise;Functional Mobility Training;Patient/family education;Neuromuscular education;Manual Therapy;Balance training;Therapeutic exercises;DME and/or AE instruction;Therapeutic activities   Plan Plan to continue focusing on strength, coordination, and standing endurance for IADL tasks.   Consulted and Agree with Plan of Care Patient        Problem List Patient Active Problem List   Diagnosis Date Noted  .  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 Hypertensive urgency 09/27/2015  . TIA (transient ischemic attack) 09/27/2015   Olegario Messier, MS, OTR/L  Olegario Messier 10/20/2015, 9:05 AM  [facility] MAIN [facility] SERVICES [address] [place], [place], [address] Phone: [phone]   Fax:  [phone]  Name: Stephanie Hawkins MRN: [id number] Date of Birth: Jun 03, 1971

## 2015-10-21 ENCOUNTER — Ambulatory Visit: Payer: BC Managed Care – PPO | Attending: Neurology

## 2015-10-21 ENCOUNTER — Ambulatory Visit (HOSPITAL_COMMUNITY)
Admission: RE | Admit: 2015-10-21 | Discharge: 2015-10-21 | Disposition: A | Discharge status: Home / Self Care | Payer: BC Managed Care – PPO | Source: Ambulatory Visit | Attending: Interventional Radiology | Admitting: Interventional Radiology

## 2015-10-21 DIAGNOSIS — G459 Transient cerebral ischemic attack, unspecified: Secondary | ICD-10-CM

## 2015-10-24 ENCOUNTER — Ambulatory Visit: Payer: BC Managed Care – PPO

## 2015-10-24 DIAGNOSIS — IMO0002 Reserved for concepts with insufficient information to code with codable children: Secondary | ICD-10-CM

## 2015-10-24 DIAGNOSIS — M6281 Muscle weakness (generalized): Secondary | ICD-10-CM | POA: Diagnosis not present

## 2015-10-24 NOTE — Therapy (Signed)
[place] [facility] MAIN [facility] SERVICES [address] [place], [place], [address] Phone: [phone]   Fax:  [phone]  Physical Therapy Treatment  Patient Details  Name: Stephanie Hawkins MRN: [id number] Date of Birth: 08/23/1971 Referring Provider: Dr Sherryll Burger  Encounter Date: 10/24/2015      PT End of Session - 10/24/15 1029    Visit Number 2   Number of Visits 9   Date for PT Re-Evaluation 11/16/15   PT Start Time 0920   PT Stop Time 1015   PT Time Calculation (min) 55 min   Equipment Utilized During Treatment Gait belt   Activity Tolerance Patient tolerated treatment well;Patient limited by fatigue   Behavior During Therapy [facility] for tasks assessed/performed      Past Medical History  Diagnosis Date  . Hypertension   . Stroke [facility])     Past Surgical History  Procedure Laterality Date  . Tubal ligation    . Tee without cardioversion N/A 09/29/2015    Procedure: Transesophageal Echocardiogram (Tee);  Surgeon: Laurier Nancy, MD;  Location: [facility] ORS;  Service: Cardiovascular;  Laterality: N/A;    There were no vitals filed for this visit.  Visit Diagnosis:  Muscle weakness (generalized)  Lack of coordination due to stroke      Subjective Assessment - 10/24/15 1024    Subjective pt states that she has been trying to stand in church for longer periods of time and gain "a little more" every day. pt reports some fatigue with L UE with increased use and occasionally "popping" of L knee in and out of locked position. pt states she is going out of town for a few days but with do HEP while away.   Limitations Standing;Walking   How long can you stand comfortably? 10 min stand then needs rest   How long can you walk comfortably? 10 min   Patient Stated Goals walk without AD   Currently in Pain? No/denies      Therapeutic exercise:  Nustep x 4 min (no charge)  Sit to stand from chair 10 x 2 sets with rest break after 5 reps due to  fatigue Forward step ups to 6 in step in // bars 10 x 2 sets Lateral band walks with yellow band in // bars 2 laps x 2 sets Calf raises in // bars 10 x 2 sets Standing marches in // bars 10 x 2 sets   Pt required min cues for exercise sequencing and SBA for safety with multiple rest breaks due to fatigue        PT Education - 10/24/15 1028    Education provided Yes   Education Details patient was educated in [employer]) Educated Patient   Methods Explanation;Demonstration;Tactile cues;Verbal cues;Handout   Comprehension Verbalized understanding;Returned demonstration;Verbal cues required             PT Long Term Goals - 10/19/15 1815    PT LONG TERM GOAL #1   Title pt will improve 5x STS without UE use to <10 sec to demonstrate increased strength and decreased balance dysfunction   Baseline 18.97s   Time 4   Period Weeks   Status New   PT LONG TERM GOAL #2   Title pt will increase walk speed to 1.2 m/s without use of an AD to be a safe community ambulator   Baseline 0.85 m/s   Time 4   Period Weeks   Status New   PT LONG TERM GOAL #  3   Title pt will score >51/56 on the Berg Balance test to demonstrate low fall risk   Baseline 49/56   Time 4   Period Weeks   Status New   PT LONG TERM GOAL #4   Title pt will be able to acend and decend 8 stairs in step over step pattern without AD use or railing, independently, in order to have access to second story of home   Time 4   Period Weeks   Status New               Plan - 10/24/15 1030    Clinical Impression Statement pt did well in treatment with focus on activation of L LE musculature. pt required rest breaks between sets and after 5 sit to stand reps but does well with correction of form with exercises. pt demonstrates decreased loading of left leg in lateral stepping with poor control of left leg in ascending and descending from chair. pt will benefit from continued therapy to improve control, acitvation  and strength of L LE and improve balance.   Pt will benefit from skilled therapeutic intervention in order to improve on the following deficits Abnormal gait;Decreased balance;Decreased endurance;Decreased strength;Difficulty walking;Impaired sensation   Rehab Potential Excellent   PT Frequency 2x / week   PT Duration 4 weeks   PT Treatment/Interventions ADLs/Self Care Home Management;Electrical Stimulation;Iontophoresis /ml Dexamethasone;Ultrasound;Gait training;Stair training;Functional mobility training;Therapeutic exercise;Balance training;Neuromuscular re-education;Patient/family education;Manual techniques;Energy conservation   PT Next Visit Plan progression of therapeutic exercise   PT Home Exercise Plan sit to stand, calf raises, lateral band walks, marches   Consulted and Agree with Plan of Care Patient        Problem List Patient Active Problem List   Diagnosis Date Noted  . 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 Hypertensive urgency 09/27/2015  . TIA (transient ischemic attack) 09/27/2015   Estella Husk, SPT This entire session was performed under direct supervision and direction of a licensed therapist/therapist assistant . I have personally read, edited and approve of the note as written. Carlyon Shadow. Tortorici, PT, DPT [phone]  Tortorici,Ashley 10/24/2015, 4:01 PM  [place] MAIN [facility] SERVICES [address] [place], [place], [address] Phone: [phone]   Fax:  [phone]  Name: Stephanie Hawkins MRN: [id number] Date of Birth: September 10, 1970

## 2015-10-25 ENCOUNTER — Ambulatory Visit: Payer: BC Managed Care – PPO

## 2015-10-25 ENCOUNTER — Ambulatory Visit: Payer: BC Managed Care – PPO | Admitting: Occupational Therapy

## 2015-10-25 DIAGNOSIS — Z741 Need for assistance with personal care: Secondary | ICD-10-CM

## 2015-10-25 DIAGNOSIS — R460 Very low level of personal hygiene: Secondary | ICD-10-CM

## 2015-10-25 DIAGNOSIS — IMO0002 Reserved for concepts with insufficient information to code with codable children: Secondary | ICD-10-CM

## 2015-10-25 DIAGNOSIS — M6281 Muscle weakness (generalized): Secondary | ICD-10-CM | POA: Diagnosis not present

## 2015-10-26 ENCOUNTER — Encounter: Payer: Self-pay | Admitting: Occupational Therapy

## 2015-10-26 NOTE — Therapy (Signed)
[place] [facility] MAIN [facility] SERVICES [address] [place], [place], [address] Phone: [phone]   Fax:  [phone]  Occupational Therapy Treatment  Patient Details  Name: Stephanie Hawkins MRN: [id number] Date of Birth: [birth year] Referring Provider: Sherryll Burger  Encounter Date: 10/25/2015      OT End of Session - 10/26/15 1617    Visit Number 4   Number of Visits 16   Date for OT Re-Evaluation 12/07/15   OT Start Time 1115   OT Stop Time 1201   OT Time Calculation (min) 46 min   Activity Tolerance Patient tolerated treatment well   Behavior During Therapy [facility] for tasks assessed/performed      Past Medical History  Diagnosis Date  . Hypertension   . Stroke [facility])     Past Surgical History  Procedure Laterality Date  . Tubal ligation    . Tee without cardioversion N/A 09/29/2015    Procedure: Transesophageal Echocardiogram (Tee);  Surgeon: Laurier Nancy, MD;  Location: [facility] ORS;  Service: Cardiovascular;  Laterality: N/A;    There were no vitals filed for this visit.  Visit Diagnosis:  Muscle weakness (generalized)  Lack of coordination due to stroke  Self-care deficit for dressing and grooming      Subjective Assessment - 10/26/15 1612    Subjective  Patient reports she is trying to do more at home, her husband "released"  her to do more such as use the microwave, perform some cleaning tasks. She has to be careful in regards to balance.     Patient Stated Goals Patient wants to be independent with all tasks and return to work as soon as possible.    Currently in Pain? No/denies   Pain Score 0-No pain                      OT Treatments/Exercises (OP) - 10/25/15 1614    Fine Motor Coordination   Other Fine Motor Exercises Patient seen for manipulation of various sized nuts and bolts placed on an elevated surface with cues for technique.  Difficulty noted with thumb finger combinations to complete task.  Manipulation of small  1/2 inch sized pegs to pick up from table and place into board, removal with left hand and using the hand for storage.     Neurological Re-education Exercises   Other Exercises 1 Patient seen for UE strengthening tasks with resistive pinch pins all levels except for black, placing onto elevated surface requiring active reach.  Red theraband exercises for shoulder flexion, ABD/ADD, diagonal patterns and elbow flexion/extension with cues and rest breaks as needed.  Difficulty noted with diagonal pattern.                 OT Education - 10/26/15 1617    Education provided Yes   Education Details HEP, strength, coordination in relation to self care and daily tasks.    Person(s) Educated Patient   Methods Explanation;Demonstration;Verbal cues   Comprehension Verbal cues required;Returned demonstration;Verbalized understanding             OT Long Term Goals - 10/13/15 1112    OT LONG TERM GOAL #1   Title Patient will improve LUE strength by 1 mm grade to demonstrate lifting of pot filled with water to be independent with cooking.   Baseline unable to lift pots at evaluation especially if filled.    Time 8   Period Weeks   Status New   OT LONG TERM GOAL #  2   Title Patient will improve LUE grip and strength sufficient to carry full laundry basket of clothes safely to laundry room using BUE.     Baseline unable at evaluation.   Time 8   Period Weeks   Status New   OT LONG TERM GOAL #3   Title Patient will be able to carry light weight items up and down stairs with modified technique and good balance.    Baseline unable at evaluation   Time 8   Period Weeks   Status New   OT LONG TERM GOAL #4   Title Patient will be able to stand up to 20 minutes to wash dishes and/or fold laundry.     Baseline has to sit to complete laundry folding.   Time 8   Period Weeks   Status New   OT LONG TERM GOAL #5   Title Patient will demonstrate typing of 3 paragraphs in 15 minutes with less  than 5 errors.    Baseline slow to type   Time 8   Period Weeks   Status New   Long Term Additional Goals   Additional Long Term Goals Yes   OT LONG TERM GOAL #6   Title Patient will increase grip strength on L hand by 15 pounds to be able to open jars and containers.    Baseline right grip 66#, left 39# at eval   Time 8   Period Weeks   Status New               Plan - 10/26/15 1618    Clinical Impression Statement Patient is starting to spend short times alone at home, has been able to put food in the microwave to heat up and attempting to participate more in light homemaking tasks. She did report having some difficulty with getting in and out of the car today and realized she has trouble with carrying items while trying to get in the car.   Will continue to work on tasks to improve independence in daily activities.    Pt will benefit from skilled therapeutic intervention in order to improve on the following deficits (Retired) Decreased endurance;Decreased activity tolerance;Decreased knowledge of use of DME;Decreased strength;Decreased balance;Decreased mobility;Pain;Decreased coordination;Impaired UE functional use   Rehab Potential Excellent   OT Frequency 2x / week   OT Duration 8 weeks   OT Treatment/Interventions Self-care/ADL training;Therapeutic exercise;Functional Mobility Training;Patient/family education;Neuromuscular education;Manual Therapy;Balance training;Therapeutic exercises;DME and/or AE instruction;Therapeutic activities   Consulted and Agree with Plan of Care Patient        Problem List Patient Active Problem List   Diagnosis Date Noted  . 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 Hypertensive urgency 09/27/2015  . TIA (transient ischemic attack) 09/27/2015  Freeda Spivey T Arne Cleveland, OTR/L, CLT   Cera Rorke 10/26/2015, 4:23 PM  [place] [facility] MAIN [facility] SERVICES [address] [place],  [place], [address] Phone: [phone]   Fax:  [phone]  Name: Stephanie Hawkins MRN: [id number] Date of Birth: Mar 22, 1971

## 2015-10-27 ENCOUNTER — Encounter: Payer: BC Managed Care – PPO | Admitting: Occupational Therapy

## 2015-10-27 ENCOUNTER — Ambulatory Visit: Payer: BC Managed Care – PPO

## 2015-10-31 ENCOUNTER — Ambulatory Visit
Admission: RE | Admit: 2015-10-31 | Discharge: 2015-10-31 | Disposition: A | Discharge status: Home / Self Care | Payer: BC Managed Care – PPO | Source: Ambulatory Visit | Attending: Family Medicine | Admitting: Family Medicine

## 2015-10-31 DIAGNOSIS — Z1231 Encounter for screening mammogram for malignant neoplasm of breast: Secondary | ICD-10-CM | POA: Insufficient documentation

## 2015-11-01 ENCOUNTER — Ambulatory Visit: Payer: BC Managed Care – PPO | Attending: Neurology

## 2015-11-01 ENCOUNTER — Encounter: Payer: Self-pay | Admitting: Occupational Therapy

## 2015-11-01 ENCOUNTER — Ambulatory Visit: Payer: BC Managed Care – PPO

## 2015-11-01 ENCOUNTER — Ambulatory Visit: Payer: BC Managed Care – PPO | Admitting: Occupational Therapy

## 2015-11-01 DIAGNOSIS — R279 Unspecified lack of coordination: Secondary | ICD-10-CM

## 2015-11-01 DIAGNOSIS — R531 Weakness: Secondary | ICD-10-CM

## 2015-11-01 DIAGNOSIS — M6281 Muscle weakness (generalized): Secondary | ICD-10-CM | POA: Diagnosis not present

## 2015-11-01 DIAGNOSIS — G4733 Obstructive sleep apnea (adult) (pediatric): Secondary | ICD-10-CM | POA: Insufficient documentation

## 2015-11-01 NOTE — Patient Instructions (Addendum)

## 2015-11-01 NOTE — Therapy (Signed)
[facility] MAIN [facility] SERVICES [address] [place], [place], [address] Phone: [phone]   Fax:  [phone]  Occupational Therapy Treatment  Patient Details  Name: Stephanie Hawkins MRN: [id number] Date of Birth: 20-Sep-1970 Referring Provider: Sherryll Burger  Encounter Date: 11/01/2015      OT End of Session - 11/01/15 1716    Visit Number 5   Number of Visits 16   Date for OT Re-Evaluation 12/07/15   OT Start Time 1545   OT Stop Time 1630   OT Time Calculation (min) 45 min   Activity Tolerance Patient tolerated treatment well   Behavior During Therapy [facility] for tasks assessed/performed      Past Medical History  Diagnosis Date  . Hypertension   . Stroke [facility])     Past Surgical History  Procedure Laterality Date  . Tubal ligation    . Tee without cardioversion N/A 09/29/2015    Procedure: Transesophageal Echocardiogram (Tee);  Surgeon: Laurier Nancy, MD;  Location: [facility] ORS;  Service: Cardiovascular;  Laterality: N/A;    There were no vitals filed for this visit.  Visit Diagnosis:  Lack of coordination  Weakness generalized      Subjective Assessment - 11/01/15 1709    Subjective  Pt. reports she had a balance    Pertinent History Stephanie Hawkins is a 45 y.o. female with a known history of hypertension admitted to [facility] on 09/27/2015 with left lower extremity weakness. Patient woke up that morning was trying to go to the bathroom to brush her teeth. She felt weak in her left leg and also noticed some weakness in the left upper extremity. Patient says her left leg was weaker than the left upper extremity when she first noticed it. No history of any slurred speech. Patient had not been taking blood pressure medication for the last 1 year. Has not followed up with any primary care physician for the last few years. When patient presented to the emergency room her blood pressure was high and systolic blood pressure was more than 210 mm of Hg and  diastolic pressure was around 160 mm of Hg. Initial CT scan of the head did not show any intracranial abnormality. No history of any headache dizziness or blurry vision. No history of any fall or any seizures. Pt reports full independence with community mobility prior to arrival. 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 Brain MRI showed small acute lacunar CVA [name] as well as chronic L CVA. Since discharge she has not been able to perform higher level IADL tasks such as laundry, cooking, cleaning.  She attempted to perform laundry and had to sit to fold clothes. Her church family has been bringing in meals.    Limitations Standing IADL tasks, UE strength and impaired coordination and hand function.   Patient Stated Goals Patient wants to be independent with all tasks and return to work as soon as possible.    Currently in Pain? No/denies   Pain Score 0-No pain                      OT Treatments/Exercises (OP) - 11/01/15 0001    Fine Motor Coordination   Other Fine Motor Exercises Pt. performed tasks to work on translatory movements of the hand with emphasis placed on moving objects within the hand in preparation for using the hand during daily tasks.  Pt. worked on the skills needed to move objects within the hand, using 1 inch balls. Pt. also worked on translatory movements needed to bring the object from the palm of the hand to the tip of the thumb and second digit in preparation for functional use the object. Pt. performed [facility] tasks using the Grooved pegboard. Attempting to store pegs in ulnar palmar aspect of the hand. Pt. worked on grasping the grooved pegs from a horizontal position, and moving the pegs to a vertical position in the hand to prepare for placing them in the grooved slot. Pt. removed the pegs while alternating thumb opposition to 2nd-5th digits. Translatory movements were worked on  to improve functional hand use during ADL and IADLs.   Neurological Re-education Exercises   Other Exercises 1 2# dumbbell ex. for elbow flexion and extension, forearm supination/pronation, wrist flexion/extension, and radial deviation. Pt. requires rest breaks and verbal cues for proper technique, and for forearm support during wrist ex.                OT Education - 11/01/15 1716    Education provided Yes   Person(s) Educated Patient             OT Long Term Goals - 10/13/15 1112    OT LONG TERM GOAL #1   Title Patient will improve LUE strength by 1 mm grade to demonstrate lifting of pot filled with water to be independent with cooking.   Baseline unable to lift pots at evaluation especially if filled.    Time 8   Period Weeks   Status New   OT LONG TERM GOAL #2   Title Patient will improve LUE grip and strength sufficient to carry full laundry basket of clothes safely to laundry room using BUE.     Baseline unable at evaluation.   Time 8   Period Weeks   Status New   OT LONG TERM GOAL #3   Title Patient will be able to carry light weight items up and down stairs with modified technique and good balance.    Baseline unable at evaluation   Time 8   Period Weeks   Status New   OT LONG TERM GOAL #4   Title Patient will be able to stand up to 20 minutes to wash dishes and/or fold laundry.     Baseline has to sit to complete laundry folding.   Time 8   Period Weeks   Status New   OT LONG TERM GOAL #5   Title Patient will demonstrate typing of 3 paragraphs in 15 minutes with less than 5 errors.    Baseline slow to type   Time 8   Period Weeks   Status New   Long Term Additional Goals   Additional Long Term Goals Yes   OT LONG TERM GOAL #6   Title Patient will increase grip strength on L hand by 15 pounds to be able to open jars and containers.    Baseline right grip 66#, left 39# at eval   Time 8   Period Weeks   Status New               Plan -  11/01/15 1718    Clinical Impression Statement Pt. continues to work on improving left hand,strength, hand function, and coordination skills for ADL and IADL tasks. Pt. continues to present with limited endurance and activity taolerance during ADL and IADL tasks.   Pt will benefit from skilled therapeutic intervention  in order to improve on the following deficits (Retired) Decreased endurance;Decreased activity tolerance;Decreased knowledge of use of DME;Decreased strength;Decreased balance;Decreased mobility;Pain;Decreased coordination;Impaired UE functional use   Rehab Potential Excellent   OT Frequency 2x / week   OT Duration 8 weeks   OT Treatment/Interventions Self-care/ADL training;Therapeutic exercise;Functional Mobility Training;Patient/family education;Neuromuscular education;Manual Therapy;Balance training;Therapeutic exercises;DME and/or AE instruction;Therapeutic activities        Problem List Patient Active Problem List   Diagnosis Date Noted  . 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 Hypertensive urgency 09/27/2015  . TIA (transient ischemic attack) 09/27/2015   Olegario Messier, MS, OTR/L   Olegario Messier 11/01/2015, 5:36 PM  [place] [facility] MAIN [facility] SERVICES [address] [place], [place], [address] Phone: [phone]   Fax:  [phone]  Name: Stephanie Hawkins MRN: [id number] Date of Birth: [birth year]

## 2015-11-02 NOTE — Therapy (Signed)
[place] [facility] MAIN [facility] SERVICES [address] [place], [place], [address] Phone: [phone]   Fax:  [phone]  Physical Therapy Treatment  Patient Details  Name: Nakoma Gotwalt MRN: [id number] Date of Birth: 13-Sep-1970 Referring Provider: Dr Sherryll Burger  Encounter Date: 11/01/2015      PT End of Session - 11/02/15 1305    Visit Number 3   Number of Visits 9   Date for PT Re-Evaluation 11/16/15   PT Start Time 1600   PT Stop Time 1645   PT Time Calculation (min) 45 min   Equipment Utilized During Treatment Gait belt   Activity Tolerance Patient tolerated treatment well;Patient limited by fatigue   Behavior During Therapy [facility] for tasks assessed/performed      Past Medical History  Diagnosis Date  . Hypertension   . Stroke [facility])     Past Surgical History  Procedure Laterality Date  . Tubal ligation    . Tee without cardioversion N/A 09/29/2015    Procedure: Transesophageal Echocardiogram (Tee);  Surgeon: Laurier Nancy, MD;  Location: [facility] ORS;  Service: Cardiovascular;  Laterality: N/A;    There were no vitals filed for this visit.  Visit Diagnosis:  Lack of coordination  Weakness generalized      Subjective Assessment - 11/01/15 1640    Subjective pt reports she is getting stronger, but still gets tired every day.    Limitations Standing;Walking   How long can you stand comfortably? 10 min stand then needs rest   How long can you walk comfortably? 10 min   Patient Stated Goals walk without AD       ThereX: Nustep L 5 x 3 min warm up no charge Leg press LLE only assist for terminal knee extension control 45lbs 2x10 TKE with red band in standing 2x10 Side stepping with red band around ankles in// bars x 5 laps Standing resisted hip flexion, abduction, and extension with   red     Band 2  x 10 each way   pt reports fatigue with exercises she needs motivations cues at times to continue exercises.                               PT Long Term Goals - 10/19/15 1815    PT LONG TERM GOAL #1   Title pt will improve 5x STS without UE use to <10 sec to demonstrate increased strength and decreased balance dysfunction   Baseline 18.97s   Time 4   Period Weeks   Status New   PT LONG TERM GOAL #2   Title pt will increase walk speed to 1.2 m/s without use of an AD to be a safe community ambulator   Baseline 0.85 m/s   Time 4   Period Weeks   Status New   PT LONG TERM GOAL #3   Title pt will score >51/56 on the Berg Balance test to demonstrate low fall risk   Baseline 49/56   Time 4   Period Weeks   Status New   PT LONG TERM GOAL #4   Title pt will be able to acend and decend 8 stairs in step over step pattern without AD use or railing, independently, in order to have access to second story of home   Time 4   Period Weeks   Status New               Plan -  11/02/15 1306    Clinical Impression Statement pt did fair with PT session today, she has quite poor endurance. PT issued progressive walking program today to improve endurance. pt is very slow with all exercises, needing motivational cues.    Pt will benefit from skilled therapeutic intervention in order to improve on the following deficits Abnormal gait;Decreased balance;Decreased endurance;Decreased strength;Difficulty walking;Impaired sensation   Rehab Potential Excellent   PT Frequency 2x / week   PT Duration 4 weeks   PT Treatment/Interventions ADLs/Self Care Home Management;Electrical Stimulation;Iontophoresis /ml Dexamethasone;Ultrasound;Gait training;Stair training;Functional mobility training;Therapeutic exercise;Balance training;Neuromuscular re-education;Patient/family education;Manual techniques;Energy conservation   PT Next Visit Plan progression of therapeutic exercise   PT Home Exercise Plan sit to stand, calf raises, lateral band walks, marches   Consulted and Agree with Plan of Care  Patient        Problem List Patient Active Problem List   Diagnosis Date Noted  . 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 Hypertensive urgency 09/27/2015  . TIA (transient ischemic attack) 09/27/2015   Carlyon Shadow. Jaime Dome, PT, DPT [phone]  Ora Bollig 11/02/2015, 1:20 PM  [place] [facility] MAIN [facility] SERVICES [address] [place], [place], [address] Phone: [phone]   Fax:  [phone]  Name: Jared Cahn MRN: [id number] Date of Birth: 19-Jul-1971

## 2015-11-03 ENCOUNTER — Ambulatory Visit: Payer: BC Managed Care – PPO | Attending: Neurology | Admitting: Occupational Therapy

## 2015-11-03 DIAGNOSIS — M6281 Muscle weakness (generalized): Secondary | ICD-10-CM | POA: Insufficient documentation

## 2015-11-03 DIAGNOSIS — R29810 Facial weakness: Secondary | ICD-10-CM | POA: Insufficient documentation

## 2015-11-03 DIAGNOSIS — R531 Weakness: Secondary | ICD-10-CM

## 2015-11-03 DIAGNOSIS — R279 Unspecified lack of coordination: Secondary | ICD-10-CM

## 2015-11-03 DIAGNOSIS — R278 Other lack of coordination: Secondary | ICD-10-CM | POA: Insufficient documentation

## 2015-11-03 DIAGNOSIS — I6980 Unspecified sequelae of other cerebrovascular disease: Secondary | ICD-10-CM | POA: Insufficient documentation

## 2015-11-03 NOTE — Therapy (Signed)
[facility] MAIN [facility] SERVICES [address] [place], [place], [address] Phone: [phone]   Fax:  [phone]  Occupational Therapy Treatment  Patient Details  Name: Stephanie Hawkins MRN: [id number] Date of Birth: [birth year] Referring Provider: Sherryll Burger  Encounter Date: 11/03/2015      OT End of Session - 11/03/15 1659    Visit Number 6   Number of Visits 16   Date for OT Re-Evaluation 12/07/15   OT Start Time 1550   OT Stop Time 1630   OT Time Calculation (min) 40 min   Activity Tolerance Patient tolerated treatment well   Behavior During Therapy [facility] for tasks assessed/performed      Past Medical History  Diagnosis Date  . Hypertension   . Stroke [facility])     Past Surgical History  Procedure Laterality Date  . Tubal ligation    . Tee without cardioversion N/A 09/29/2015    Procedure: Transesophageal Echocardiogram (Tee);  Surgeon: Laurier Nancy, MD;  Location: [facility] ORS;  Service: Cardiovascular;  Laterality: N/A;    There were no vitals filed for this visit.  Visit Diagnosis:  Lack of coordination  Weakness generalized      Subjective Assessment - 11/03/15 1706    Subjective  Pt. reports she is having difficulty opening medicine bottles, and holding medicine in her hand.   Patient is accompained by: Family member   Pertinent History Stephanie Hawkins is a 45 y.o. female with a known history of hypertension admitted to [facility] on 09/27/2015 with left lower extremity weakness. Patient woke up that morning was trying to go to the bathroom to brush her teeth. She felt weak in her left leg and also noticed some weakness in the left upper extremity. Patient says her left leg was weaker than the left upper extremity when she first noticed it. No history of any slurred speech. Patient had not been taking blood pressure medication for the last 1 year. Has not followed up with any primary care physician for the last few years. When patient presented to the  emergency room her blood pressure was high and systolic blood pressure was more than 210 mm of Hg and diastolic pressure was around 160 mm of Hg. Initial CT scan of the head did not show any intracranial abnormality. No history of any headache dizziness or blurry vision. No history of any fall or any seizures. Pt reports full independence with community mobility prior to arrival. 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 Brain MRI showed small acute lacunar CVA R posterior corona radiata to R putamen as well as chronic L CVA. Since discharge she has not been able to perform higher level IADL tasks such as laundry, cooking, cleaning.  She attempted to perform laundry and had to sit to fold clothes. Her church family has been bringing in meals.    Limitations Standing IADL tasks, UE strength and impaired coordination and hand function.   Patient Stated Goals Patient wants to be independent with all tasks and return to work as soon as possible.                       OT Treatments/Exercises (OP) - 11/03/15 1720    Fine Motor Coordination   Other Fine Motor Exercises Pt. worked on grasping coins with the left hand from a flat tabletop surface, and placing them into a container. Also, grasping coins from a  wallet/change purse. Pt. had more difficulty with grasping the coins from a smaller change purse as opposed to a larger one. Pt. worked on opening medicine bottles and simulated holding medicine (beads) with left hand, while manipulating them with the right hand. Pt. worked 2pt. pinch grasping with mini clips, while sustaining the grasp and placing them on a vertical surface.   Neurological Re-education Exercises   Other Exercises 1 Pt. performed gross gripping with grip strengthener. Pt. worked on sustaining grip while grasping pegs and reaching at various heights. Performed in sitting. Pt. Worked on pinch strengthening in the left hand  for lateral pinch and 3pt. pinch using red and green resistive clips.Tactlie and verbal cues were required for eliciting the desired movement. Pt. worked on reaching to place the clips engaging shoulder flexion, and shoulder abduction.                OT Education - 11/03/15 1649    Education provided Yes   Education Details Pt. ed was provided about [facility] exercises at home.             OT Long Term Goals - 10/13/15 1112    OT LONG TERM GOAL #1   Title Patient will improve LUE strength by 1 mm grade to demonstrate lifting of pot filled with water to be independent with cooking.   Baseline unable to lift pots at evaluation especially if filled.    Time 8   Period Weeks   Status New   OT LONG TERM GOAL #2   Title Patient will improve LUE grip and strength sufficient to carry full laundry basket of clothes safely to laundry room using BUE.     Baseline unable at evaluation.   Time 8   Period Weeks   Status New   OT LONG TERM GOAL #3   Title Patient will be able to carry light weight items up and down stairs with modified technique and good balance.    Baseline unable at evaluation   Time 8   Period Weeks   Status New   OT LONG TERM GOAL #4   Title Patient will be able to stand up to 20 minutes to wash dishes and/or fold laundry.     Baseline has to sit to complete laundry folding.   Time 8   Period Weeks   Status New   OT LONG TERM GOAL #5   Title Patient will demonstrate typing of 3 paragraphs in 15 minutes with less than 5 errors.    Baseline slow to type   Time 8   Period Weeks   Status New   Long Term Additional Goals   Additional Long Term Goals Yes   OT LONG TERM GOAL #6   Title Patient will increase grip strength on L hand by 15 pounds to be able to open jars and containers.    Baseline right grip 66#, left 39# at eval   Time 8   Period Weeks   Status New               Plan - 11/03/15 1700    Clinical Impression Statement Pt. is making  progress overall and reports being able to type more accurately yesterday. Pt. reports spped is still a factor. Pt. continues to have difficulty opening medicine bottles, and holding medication with her left hand.  Pt. continues to work on improving the necessary strength and coordination needed for completing ADl and IADL tasks.   Rehab Potential Excellent  OT Frequency 2x / week   OT Duration 8 weeks   OT Treatment/Interventions Self-care/ADL training;Therapeutic exercise;Functional Mobility Training;Patient/family education;Neuromuscular education;Manual Therapy;Balance training;Therapeutic exercises;DME and/or AE instruction;Therapeutic activities   Consulted and Agree with Plan of Care Patient        Problem List Patient Active Problem List   Diagnosis Date Noted  . 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 Hypertensive urgency 09/27/2015  . TIA (transient ischemic attack) 09/27/2015   Olegario Messier, MS, OTR/L   Olegario Messier 11/03/2015, 5:21 PM  [facility] MAIN [facility] SERVICES [address] [place], [place], [address] Phone: [phone]   Fax:  [phone]  Name: Yelina Sarratt MRN: [id number] Date of Birth: March 14, 1971

## 2015-11-08 ENCOUNTER — Ambulatory Visit: Payer: BC Managed Care – PPO | Admitting: Occupational Therapy

## 2015-11-08 ENCOUNTER — Ambulatory Visit: Payer: BC Managed Care – PPO

## 2015-11-08 DIAGNOSIS — R531 Weakness: Secondary | ICD-10-CM

## 2015-11-08 DIAGNOSIS — R279 Unspecified lack of coordination: Secondary | ICD-10-CM | POA: Diagnosis not present

## 2015-11-08 NOTE — Therapy (Signed)
[facility] [address] [place], [place], [address] Phone: [phone]   Fax:  [phone]  Physical Therapy Treatment  Patient Details  Name: Stephanie BoughLakecia Hawkins MRN: [id number] Date of Birth: 01-09-1971 Referring Provider: Dr Sherryll BurgerShah  Encounter Date: 11/08/2015      PT End of Session - 11/08/15 1650    Visit Number 4   Number of Visits 9   Date for PT Re-Evaluation 11/16/15   PT Start Time 1645   PT Stop Time 1730   PT Time Calculation (min) 45 min   Equipment Utilized During Treatment Gait belt   Activity Tolerance Patient tolerated treatment well;Patient limited by fatigue   Behavior [facility] for tasks assessed/performed      Past Medical History  Diagnosis Date  . Hypertension   . Stroke [facility](HCC)     Past Surgical History  Procedure Laterality Date  . Tubal ligation    . Tee without cardioversion N/A 09/29/2015    Procedure: Transesophageal Echocardiogram (Tee);  Surgeon: Laurier NancyShaukat A Khan, MD;  Location: [facility] ORS;  Service: Cardiovascular;  Laterality: N/A;    There were no vitals filed for this visit.  Visit Diagnosis:  Lack of coordination  Weakness generalized      Subjective Assessment - 11/08/15 1649    Subjective pt reports she started her walking program, and is able to walk for 9 min without rest. she reports she feels like her endurance is better, even after 1 week.    Limitations Standing;Walking   How long can you stand comfortably? 10 min stand then needs rest   How long can you walk comfortably? 10 min   Patient Stated Goals walk without AD   Currently in Pain? No/denies     Therex Nustep: L4 x 4 min no charge warm up Leg press:  SLR: 60lbs 2x10 with max cues for quad set at end range TKE with ball on wall x15 Squat with RLE elevated 3x5 Pt requires min verbal and tactile cues for proper exercise performance   Gait training:  over ground and on TM with quad cane 3 min x 5 with cues  for terminal knee extension and quad set at heel strike Hip hikes 3x10 Retro walking with cues for quad set at [facility] - needs CGA                                PT Long Term Goals - 10/19/15 1815    PT LONG TERM GOAL #1   Title pt will improve 5x STS without UE use to <10 sec to demonstrate increased strength and decreased balance dysfunction   Baseline 18.97s   Time 4   Period Weeks   Status New   PT LONG TERM GOAL #2   Title pt will increase walk speed to 1.2 m/s without use of an AD to be a safe community ambulator   Baseline 0.85 m/s   Time 4   Period Weeks   Status New   PT LONG TERM GOAL #3   Title pt will score >51/56 on the Berg Balance test to demonstrate low fall risk   Baseline 49/56   Time 4   Period Weeks   Status New   PT LONG TERM GOAL #4   Title pt will be able to acend and decend 8 stairs in step over step pattern without AD use or railing, independently, in order  to have access to second story of home   Time 4   Period Weeks   Status New               Plan - 11/08/15 1727    Clinical Impression Statement pt did demo better endurance in session today. good carry over of quad set into over ground and TM walking with less genu recurvatum noted. pt does have difficulty with L LE quad set / control but does better when manual cues.   Pt will benefit from skilled therapeutic intervention in order to improve on the following deficits Abnormal gait;Decreased balance;Decreased endurance;Decreased strength;Difficulty walking;Impaired sensation   Rehab Potential Excellent   PT Frequency 2x / week   PT Duration 4 weeks   PT Treatment/Interventions ADLs/Self Care Home Management;Electrical Stimulation;Iontophoresis /ml Dexamethasone;Ultrasound;Gait training;Stair training;Functional mobility training;Therapeutic exercise;Balance training;Neuromuscular re-education;Patient/family education;Manual techniques;Energy conservation   PT Next  Visit Plan progression of therapeutic exercise   PT Home Exercise Plan sit to stand, calf raises, lateral band walks, marches   Consulted and Agree with Plan of Care Patient        Problem List Patient Active Problem List   Diagnosis Date Noted  . 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 Hypertensive urgency 09/27/2015  . TIA (transient ischemic attack) 09/27/2015   Carlyon Shadow. Kathleene Bergemann, PT, DPT [phone]  Fae Blossom 11/08/2015, 5:29 PM  [facility] MAIN [facility] SERVICES [address] [place], [place], [address] Phone: [phone]   Fax:  [phone]  Name: Stephanie Hawkins MRN: [id number] Date of Birth: 02-11-1971

## 2015-11-08 NOTE — Therapy (Signed)
[facility] MAIN [facility] SERVICES [address] [place], [place], [address] Phone: [phone]   Fax:  [phone]  Occupational Therapy Treatment  Patient Details  Name: Stephanie Hawkins MRN: [id number] Date of Birth: February 17, 1971 Referring Provider: Sherryll Burger  Encounter Date: 11/08/2015      OT End of Session - 11/08/15 1718    Visit Number 7   Number of Visits 16   Date for OT Re-Evaluation 12/07/15   OT Start Time 1545   OT Stop Time 1630   OT Time Calculation (min) 45 min      Past Medical History  Diagnosis Date  . Hypertension   . Stroke [facility])     Past Surgical History  Procedure Laterality Date  . Tubal ligation    . Tee without cardioversion N/A 09/29/2015    Procedure: Transesophageal Echocardiogram (Tee);  Surgeon: Laurier Nancy, MD;  Location: [facility] ORS;  Service: Cardiovascular;  Laterality: N/A;    There were no vitals filed for this visit.  Visit Diagnosis:  Lack of coordination  Weakness generalized      Subjective Assessment - 11/08/15 1716    Subjective  Pt. reports she went shopping for a few things from [employer] independently.   Pertinent History Stephanie Hawkins is a 45 y.o. female with a known history of hypertension admitted to [facility] on 09/27/2015 with left lower extremity weakness. Patient woke up that morning was trying to go to the bathroom to brush her teeth. She felt weak in her left leg and also noticed some weakness in the left upper extremity. Patient says her left leg was weaker than the left upper extremity when she first noticed it. No history of any slurred speech. Patient had not been taking blood pressure medication for the last 1 year. Has not followed up with any primary care physician for the last few years. When patient presented to the emergency room her blood pressure was high and systolic blood pressure was more than 210 mm of Hg and diastolic pressure was around 160 mm of Hg. Initial CT scan of the head did  not show any intracranial abnormality. No history of any headache dizziness or blurry vision. No history of any fall or any seizures. Pt reports full independence with community mobility prior to arrival. 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 Brain MRI showed small acute lacunar CVA R posterior corona radiata to R putamen as well as chronic L CVA. Since discharge she has not been able to perform higher level IADL tasks such as laundry, cooking, cleaning.  She attempted to perform laundry and had to sit to fold clothes. Her church family has been bringing in meals.    Patient Stated Goals Patient wants to be independent with all tasks and return to work as soon as possible.    Currently in Pain? No/denies                      OT Treatments/Exercises (OP) - 11/08/15 1729    Fine Motor Coordination   Other Fine Motor Exercises Pt. worked on grasping the grooved pegs from a horizontal position, and moving the pegs to a vertical position in the hand to prepare for placing them in the grooved slot. Pt. worked on removing pegs alternating with thumb opposition 2nd through 5th digits.   Neurological Re-education Exercises   Other Exercises 1 Pt. performed gross gripping with grip  strengthener. Pt. worked on sustaining grip while grasping pegs and reaching at various heights. Performed it in sitting. Pt. Worked on the digiflex for resistance with alternating the 4th & 5th digits. Pt. performed hand strengthening with green theraputty. Pt. required cues for proper technique. Pt. worked on gross grip loop, lateral pinch, 3pt. pinch, gross digit extension, digit extension table spread, digit abduction loop, single digit extension loop, thumb opposition, and lumbical ex,  Pt. required verbal and tactile cues for proper technique. Pt. was provided with a visual handout.                     OT Long Term Goals - 10/13/15 1112     OT LONG TERM GOAL #1   Title Patient will improve LUE strength by 1 mm grade to demonstrate lifting of pot filled with water to be independent with cooking.   Baseline unable to lift pots at evaluation especially if filled.    Time 8   Period Weeks   Status New   OT LONG TERM GOAL #2   Title Patient will improve LUE grip and strength sufficient to carry full laundry basket of clothes safely to laundry room using BUE.     Baseline unable at evaluation.   Time 8   Period Weeks   Status New   OT LONG TERM GOAL #3   Title Patient will be able to carry light weight items up and down stairs with modified technique and good balance.    Baseline unable at evaluation   Time 8   Period Weeks   Status New   OT LONG TERM GOAL #4   Title Patient will be able to stand up to 20 minutes to wash dishes and/or fold laundry.     Baseline has to sit to complete laundry folding.   Time 8   Period Weeks   Status New   OT LONG TERM GOAL #5   Title Patient will demonstrate typing of 3 paragraphs in 15 minutes with less than 5 errors.    Baseline slow to type   Time 8   Period Weeks   Status New   Long Term Additional Goals   Additional Long Term Goals Yes   OT LONG TERM GOAL #6   Title Patient will increase grip strength on L hand by 15 pounds to be able to open jars and containers.    Baseline right grip 66#, left 39# at eval   Time 8   Period Weeks   Status New               Plan - 11/08/15 1718    Clinical Impression Statement Pt. is making progress with her left hand, however continues to present with limited strength, coordination, and hand function during ADLs and IADLs. Pt. has limited left 4th and 5th digit movement.   Pt will benefit from skilled therapeutic intervention in order to improve on the following deficits (Retired) Decreased endurance;Decreased activity tolerance;Decreased knowledge of use of DME;Decreased strength;Decreased balance;Decreased mobility;Pain;Decreased  coordination;Impaired UE functional use   Rehab Potential Excellent   OT Frequency 2x / week   OT Duration 8 weeks   OT Treatment/Interventions Self-care/ADL training;Therapeutic exercise;Functional Mobility Training;Patient/family education;Neuromuscular education;Manual Therapy;Balance training;Therapeutic exercises;DME and/or AE instruction;Therapeutic activities   Consulted and Agree with Plan of Care Patient        Problem List Patient Active Problem List   Diagnosis Date Noted  . Cerebrovascular accident (CVA) due to stenosis of cerebral  artery (HCC)   . Hypertensive urgency 09/27/2015  . TIA (transient ischemic attack) 09/27/2015   Olegario MessierElaine Vandy Tsuchiya, MS, OTR/L  Olegario MessierElaine Daulton Harbaugh 11/08/2015, 5:33 PM  [facility] [address] [place], [place], [address] Phone: [phone]   Fax:  [phone]  Name: Theresia BoughLakecia Guedea MRN: [id number] Date of Birth: 1971/04/08

## 2015-11-10 ENCOUNTER — Ambulatory Visit: Payer: BC Managed Care – PPO

## 2015-11-10 ENCOUNTER — Ambulatory Visit: Payer: BC Managed Care – PPO | Admitting: Occupational Therapy

## 2015-11-10 DIAGNOSIS — R531 Weakness: Secondary | ICD-10-CM

## 2015-11-10 DIAGNOSIS — R279 Unspecified lack of coordination: Secondary | ICD-10-CM

## 2015-11-10 NOTE — Patient Instructions (Addendum)
Pt. performed UE strengthening using the [employer] UBE. For 8 min. At level 2.5 with constant monitoring of the left hand. Pt. changed directions every 2 min. 2# dumbbell ex. for elbow flexion and extension,  forearm supination/pronation, wrist flexion/extension, and radial deviation. Pt. requires rest breaks and verbal cues for proper technique.Pt. performed resistive EZ Board exercises for forearm supination/pronation, wrist flexion/extension using gross grasp, and lateral pinch (key) grasp. Pt. performed resistive EZ Board exercises angled in several planes to promote shoulder flexion, abduction, and wrist flexion, and extension while performing resistive wrist flexion and extension with a gross grip. Pt. worked with mini clips alternating thumb opposition to 2nd-5th digits, at different angles. Pt. Worked on improving coordination and grasping 1/2" items while attention is distracted purposefully.

## 2015-11-10 NOTE — Therapy (Signed)
[place] [facility] MAIN [facility] SERVICES [address] [place], [place], [address] Phone: [phone]   Fax:  [phone]  Physical Therapy Treatment  Patient Details  Name: Stephanie Hawkins MRN: [id number] Date of Birth: Nov 29, 1970 Referring Provider: Dr Sherryll Burger  Encounter Date: 11/10/2015      PT End of Session - 11/10/15 1720    Visit Number 5   Number of Visits 9   Date for PT Re-Evaluation 11/16/15   PT Start Time 1630   PT Stop Time 1715   PT Time Calculation (min) 45 min   Equipment Utilized During Treatment Gait belt   Activity Tolerance Patient tolerated treatment well;Patient limited by fatigue   Behavior During Therapy [facility] for tasks assessed/performed      Past Medical History  Diagnosis Date  . Hypertension   . Stroke [facility])     Past Surgical History  Procedure Laterality Date  . Tubal ligation    . Tee without cardioversion N/A 09/29/2015    Procedure: Transesophageal Echocardiogram (Tee);  Surgeon: Laurier Nancy, MD;  Location: [facility] ORS;  Service: Cardiovascular;  Laterality: N/A;    There were no vitals filed for this visit.  Visit Diagnosis:  Lack of coordination  Weakness generalized      Subjective Assessment - 11/10/15 1720    Subjective pt reports she feels like she is walking better   Limitations Standing;Walking   How long can you stand comfortably? 10 min stand then needs rest   How long can you walk comfortably? 10 min   Patient Stated Goals walk without AD   Currently in Pain? No/denies     Therex: Nustep L 4 L E only x 5 min no charge Resisted fwd walking 12lbs with marching x 5 laps Resisted side stepping with squat 12lbs x 3 laps each side Hip hike 2x10 (L)  pt requires CGA for safety on balance exercises   Floor transfer x 1 with min cues  NMR: BOSU lunge 2x10 each leg Toe taps on BOSU from AIREX 2x20 NBOS EO/EC with x 10 vertical/horiz head turns   pt requires CGA for safety on balance exercises                               PT Education - 11/10/15 1720    Education provided Yes   Education Details promoting greater L heel strike    Person(s) Educated Patient   Methods Explanation   Comprehension Verbalized understanding             PT Long Term Goals - 10/19/15 1815    PT LONG TERM GOAL #1   Title pt will improve 5x STS without UE use to <10 sec to demonstrate increased strength and decreased balance dysfunction   Baseline 18.97s   Time 4   Period Weeks   Status New   PT LONG TERM GOAL #2   Title pt will increase walk speed to 1.2 m/s without use of an AD to be a safe community ambulator   Baseline 0.85 m/s   Time 4   Period Weeks   Status New   PT LONG TERM GOAL #3   Title pt will score >51/56 on the Berg Balance test to demonstrate low fall risk   Baseline 49/56   Time 4   Period Weeks   Status New   PT LONG TERM GOAL #4   Title pt will be able to  acend and decend 8 stairs in step over step pattern without AD use or railing, independently, in order to have access to second story of home   Time 4   Period Weeks   Status New               Plan - 11/10/15 1721    Clinical Impression Statement pt is showing outstanding progress with activity tolerance, balance and gait. PT progressed all activities today which pt tolerated well. genu recuvatum is much reduced in stance phase.    Pt will benefit from skilled therapeutic intervention in order to improve on the following deficits Abnormal gait;Decreased balance;Decreased endurance;Decreased strength;Difficulty walking;Impaired sensation   Rehab Potential Excellent   PT Frequency 2x / week   PT Duration 4 weeks   PT Treatment/Interventions ADLs/Self Care Home Management;Electrical Stimulation;Iontophoresis 4mg /ml Dexamethasone;Ultrasound;Gait training;Stair training;Functional mobility training;Therapeutic exercise;Balance training;Neuromuscular re-education;Patient/family  education;Manual techniques;Energy conservation   PT Next Visit Plan progression of therapeutic exercise   PT Home Exercise Plan sit to stand, calf raises, lateral band walks, marches   Consulted and Agree with Plan of Care Patient        Problem List Patient Active Problem List   Diagnosis Date Noted  . 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 Hypertensive urgency 09/27/2015  . TIA (transient ischemic attack) 09/27/2015   Carlyon ShadowAshley C. Alok Minshall, PT, DPT [phone]  Oluwanifemi Susman 11/10/2015, 5:23 PM  [place] [facility] [address] [place], [place], [address] Phone: [phone]   Fax:  [phone]  Name: Theresia BoughLakecia Krabill MRN: [id number] Date of Birth: [birth year]

## 2015-11-10 NOTE — Therapy (Signed)
[place] [facility] MAIN [facility] SERVICES [address] [place], [place], [address] Phone: [phone]   Fax:  [phone]  Occupational Therapy Treatment  Patient Details  Name: Stephanie Hawkins MRN: [id number] Date of Birth: 01-27-1971 Referring Provider: Sherryll Burger  Encounter Date: 11/10/2015      OT End of Session - 11/10/15 1639    Visit Number 8   Number of Visits 16   Date for OT Re-Evaluation 12/07/15   OT Start Time 1545   OT Stop Time 1627   OT Time Calculation (min) 42 min   Activity Tolerance Patient tolerated treatment well   Behavior During Therapy [facility] for tasks assessed/performed      Past Medical History  Diagnosis Date  . Hypertension   . Stroke [facility])     Past Surgical History  Procedure Laterality Date  . Tubal ligation    . Tee without cardioversion N/A 09/29/2015    Procedure: Transesophageal Echocardiogram (Tee);  Surgeon: Laurier Nancy, MD;  Location: [facility] ORS;  Service: Cardiovascular;  Laterality: N/A;    There were no vitals filed for this visit.  Visit Diagnosis:  Lack of coordination  Weakness generalized      Subjective Assessment - 11/10/15 1615    Subjective  Pt. reports having increased fatigue with dressing  and morning care tasks.    Pertinent History Stephanie Hawkins is a [age] y.o. female with a known history of hypertension admitted to [facility] on 09/27/2015 with left lower extremity weakness. Patient woke up that morning was trying to go to the bathroom to brush her teeth. She felt weak in her left leg and also noticed some weakness in the left upper extremity. Patient says her left leg was weaker than the left upper extremity when she first noticed it. No history of any slurred speech. Patient had not been taking blood pressure medication for the last 1 year. Has not followed up with any primary care physician for the last few years. When patient presented to the emergency room her blood pressure was high and systolic blood  pressure was more than 210 mm of Hg and diastolic pressure was around 160 mm of Hg. Initial CT scan of the head did not show any intracranial abnormality. No history of any headache dizziness or blurry vision. No history of any fall or any seizures. Pt reports full independence with community mobility prior to arrival. 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 Brain MRI showed small acute lacunar CVA R posterior corona radiata to R putamen as well as chronic L CVA. Since discharge she has not been able to perform higher level IADL tasks such as laundry, cooking, cleaning.  She attempted to perform laundry and had to sit to fold clothes. Her church family has been bringing in meals.    Limitations Standing IADL tasks, UE strength and impaired coordination and hand function.   Patient Stated Goals Patient wants to be independent with all tasks and return to work as soon as possible.    Currently in Pain? No/denies   Pain Score 0-No pain                      OT Treatments/Exercises (OP) - 11/10/15 1646    Fine Motor Coordination   Other Fine Motor Exercises Pt. worked with mini clips alternating thumb opposition to 2nd-5th digits, at different angles. Pt. Worked on improving coordination and grasping 1/2" items  while attention is distracted purposefully.   Neurological Re-education Exercises   Other Exercises 1 Pt. performed UE strengthening using the Sci[employer] UBE. For 8 min. At level 2.5 with constant monitoring of the left hand. Pt. changed directions every 2 min. 2# dumbbell ex. for elbow flexion and extension,  forearm supination/pronation, wrist flexion/extension, and radial deviation. Pt. requires rest breaks and verbal cues for proper technique.Pt. performed resistive EZ Board exercises for forearm supination/pronation, wrist flexion/extension using gross grasp, and lateral pinch (key) grasp. Pt. performed resistive EZ Board  exercises angled in several planes to promote shoulder flexion, abduction, and wrist flexion, and extension while performing resistive wrist flexion and extension with a gross grip.                 OT Education - 11/10/15 1638    Education provided Yes   Education Details Pt. ed was provided about LUE and hand function, coordination tasks.             OT Long Term Goals - 10/13/15 1112    OT LONG TERM GOAL #1   Title Patient will improve LUE strength by 1 mm grade to demonstrate lifting of pot filled with water to be independent with cooking.   Baseline unable to lift pots at evaluation especially if filled.    Time 8   Period Weeks   Status New   OT LONG TERM GOAL #2   Title Patient will improve LUE grip and strength sufficient to carry full laundry basket of clothes safely to laundry room using BUE.     Baseline unable at evaluation.   Time 8   Period Weeks   Status New   OT LONG TERM GOAL #3   Title Patient will be able to carry light weight items up and down stairs with modified technique and good balance.    Baseline unable at evaluation   Time 8   Period Weeks   Status New   OT LONG TERM GOAL #4   Title Patient will be able to stand up to 20 minutes to wash dishes and/or fold laundry.     Baseline has to sit to complete laundry folding.   Time 8   Period Weeks   Status New   OT LONG TERM GOAL #5   Title Patient will demonstrate typing of 3 paragraphs in 15 minutes with less than 5 errors.    Baseline slow to type   Time 8   Period Weeks   Status New   Long Term Additional Goals   Additional Long Term Goals Yes   OT LONG TERM GOAL #6   Title Patient will increase grip strength on L hand by 15 pounds to be able to open jars and containers.    Baseline right grip 66#, left 39# at eval   Time 8   Period Weeks   Status New               Plan - 11/10/15 1640    Clinical Impression Statement Pt. is making progress with LUE and hand function,  coordination. Pt. continues to present with fatigue that hinders her ability to complete ADL/IADLs efficiently.   Rehab Potential Excellent   OT Frequency 2x / week   OT Duration 8 weeks   OT Treatment/Interventions Self-care/ADL training;Therapeutic exercise;Functional Mobility Training;Patient/family education;Neuromuscular education;Manual Therapy;Balance training;Therapeutic exercises;DME and/or AE instruction;Therapeutic activities   Consulted and Agree with Plan of Care Patient        Problem List Patient  Active Problem List   Diagnosis Date Noted  . 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 Hypertensive urgency 09/27/2015  . TIA (transient ischemic attack) 09/27/2015   Olegario MessierElaine Estella Malatesta, MS, OTR/L  Olegario MessierElaine Jashun Puertas 11/10/2015, 4:48 PM  [place] [facility] [address] [place], [place], [address] Phone: [phone]   Fax:  [phone]  Name: Theresia BoughLakecia Sizemore MRN: [id number] Date of Birth: 1970-10-17

## 2015-11-15 ENCOUNTER — Other Ambulatory Visit: Payer: Self-pay | Admitting: Internal Medicine

## 2015-11-15 ENCOUNTER — Ambulatory Visit: Payer: BC Managed Care – PPO | Admitting: Occupational Therapy

## 2015-11-15 ENCOUNTER — Ambulatory Visit: Payer: BC Managed Care – PPO

## 2015-11-15 DIAGNOSIS — R531 Weakness: Secondary | ICD-10-CM

## 2015-11-15 DIAGNOSIS — R279 Unspecified lack of coordination: Secondary | ICD-10-CM | POA: Diagnosis not present

## 2015-11-15 DIAGNOSIS — E0590 Thyrotoxicosis, unspecified without thyrotoxic crisis or storm: Secondary | ICD-10-CM

## 2015-11-15 NOTE — Therapy (Signed)
[place] [facility] MAIN [facility] SERVICES [address] [place], [place], [address] Phone: [phone]   Fax:  [phone]  Occupational Therapy Treatment  Patient Details  Name: Stephanie Hawkins MRN: [id number] Date of Birth: May 23, 1971 Referring Provider: Sherryll Burger  Encounter Date: 11/15/2015      OT End of Session - 11/15/15 1552    Visit Number 9   Number of Visits 16   Date for OT Re-Evaluation 12/07/15   OT Start Time 1545   OT Stop Time 1627   OT Time Calculation (min) 42 min      Past Medical History  Diagnosis Date  . Hypertension   . Stroke [facility])     Past Surgical History  Procedure Laterality Date  . Tubal ligation    . Tee without cardioversion N/A 09/29/2015    Procedure: Transesophageal Echocardiogram (Tee);  Surgeon: Laurier Nancy, MD;  Location: [facility] ORS;  Service: Cardiovascular;  Laterality: N/A;    There were no vitals filed for this visit.  Visit Diagnosis:  Lack of coordination  Weakness generalized      Subjective Assessment - 11/15/15 1550    Subjective  Pt. reports going to chair yoga today   Patient is accompained by: Family member   Pertinent History Stephanie Hawkins is a 45 y.o. female with a known history of hypertension admitted to [facility] on 09/27/2015 with left lower extremity weakness. Patient woke up that morning was trying to go to the bathroom to brush her teeth. She felt weak in her left leg and also noticed some weakness in the left upper extremity. Patient says her left leg was weaker than the left upper extremity when she first noticed it. No history of any slurred speech. Patient had not been taking blood pressure medication for the last 1 year. Has not followed up with any primary care physician for the last few years. When patient presented to the emergency room her blood pressure was high and systolic blood pressure was more than 210 mm of Hg and diastolic pressure was around 160 mm of Hg. Initial CT scan of the head  did not show any intracranial abnormality. No history of any headache dizziness or blurry vision. No history of any fall or any seizures. Pt reports full independence with community mobility prior to arrival. 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 Brain MRI showed small acute lacunar CVA R posterior corona radiata to R putamen as well as chronic L CVA. Since discharge she has not been able to perform higher level IADL tasks such as laundry, cooking, cleaning.  She attempted to perform laundry and had to sit to fold clothes. Her church family has been bringing in meals.    Limitations Standing IADL tasks, UE strength and impaired coordination and hand function.   Patient Stated Goals Patient wants to be independent with all tasks and return to work as soon as possible.    Currently in Pain? No/denies   Pain Score 0-No pain                      OT Treatments/Exercises (OP) - 11/15/15 1635    Fine Motor Coordination   Other Fine Motor Exercises Pt. worked on grasping coins from a tabletop surface, placing them into a resistive container, and pushing them through the slot while isolating his 2nd digit.   Neurological Re-education Exercises   Other Exercises 1 Pt. performed  3# dowel ex. For UE strengthening secondary to weakness. Bilateral shoulder flexion, chest press, circular patterns, and elbow flexion/extension were performed, with ace wrap in place on the left hand.3# dumbbell ex. for elbow flexion and extension,  3# for forearm supination/pronation, wrist flexion/extension, and radial deviation. Pt. requires rest breaks and verbal cues for proper technique. Pt. performed resistive EZ Board exercises for forearm supination/pronation, wrist flexion/extension using gross grasp, and lateral pinch (key) grasp. Pt. performed resistive EZ Board exercises angled in several planes to promote shoulder flexion, abduction, and wrist flexion,  and extension while performing resistive wrist flexion and extension with a gross grip. EZboard was placed in overhead positions to incorporate motions used during haircare.Pt. Worked on pinch strengthening in the left hand for 3pt. pinch using resistive clips.Tactlie and verbal cues were required for eliciting the desired movement. Clips placements were angled in overhead positions to incorporate and work on movements used in [employer] ha[employer]. Pt. worked on grasping 1" cubes against resistance alternating thumb to 2nd through 5th digits. Pt. Had cramping with the 5th digit. Pt. Worked on the yellow resistive digiflex to work on alternating movement between the 4th and 5th digits.                OT Education - 11/15/15 1552    Education provided Yes   Person(s) Educated Patient   Methods Explanation;Demonstration   Comprehension Verbalized understanding;Returned demonstration             OT Long Term Goals - 10/13/15 1112    OT LONG TERM GOAL #1   Title Patient will improve LUE strength by 1 mm grade to demonstrate lifting of pot filled with water to be independent with cooking.   Baseline unable to lift pots at evaluation especially if filled.    Time 8   Period Weeks   Status New   OT LONG TERM GOAL #2   Title Patient will improve LUE grip and strength sufficient to carry full laundry basket of clothes safely to laundry room using BUE.     Baseline unable at evaluation.   Time 8   Period Weeks   Status New   OT LONG TERM GOAL #3   Title Patient will be able to carry light weight items up and down stairs with modified technique and good balance.    Baseline unable at evaluation   Time 8   Period Weeks   Status New   OT LONG TERM GOAL #4   Title Patient will be able to stand up to 20 minutes to wash dishes and/or fold laundry.     Baseline has to sit to complete laundry folding.   Time 8   Period Weeks   Status New   OT LONG TERM GOAL #5   Title Patient will  demonstrate typing of 3 paragraphs in 15 minutes with less than 5 errors.    Baseline slow to type   Time 8   Period Weeks   Status New   Long Term Additional Goals   Additional Long Term Goals Yes   OT LONG TERM GOAL #6   Title Patient will increase grip strength on L hand by 15 pounds to be able to open jars and containers.    Baseline right grip 66#, left 39# at eval   Time 8   Period Weeks   Status New               Plan - 11/15/15 1554    Clinical  Impression Statement Pt. continues to progress with her LUE strength and coordination for ADL tasks including tying shoes. Pt. has diificulty with using her LUE and hand completing tasks when arms are raised  during tasks such as haircare..   Pt will benefit from skilled therapeutic intervention in order to improve on the following deficits (Retired) Decreased endurance;Decreased activity tolerance;Decreased knowledge of use of DME;Decreased strength;Decreased balance;Decreased mobility;Pain;Decreased coordination;Impaired UE functional use   Rehab Potential Excellent   OT Frequency 2x / week   OT Duration 8 weeks   OT Treatment/Interventions Self-care/ADL training;Therapeutic exercise;Functional Mobility Training;Patient/family education;Neuromuscular education;Manual Therapy;Balance training;Therapeutic exercises;DME and/or AE instruction;Therapeutic activities   Consulted and Agree with Plan of Care Patient        Problem List Patient Active Problem List   Diagnosis Date Noted  . 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 Hypertensive urgency 09/27/2015  . TIA (transient ischemic attack) 09/27/2015   Olegario Messier, MS, OTR/L   Olegario Messier 11/15/2015, 4:38 PM  [place] [facility] MAIN [facility] SERVICES [address] [place], [place], [address] Phone: [phone]   Fax:  [phone]  Name: Stephanie Hawkins MRN: [id number] Date of Birth:  05-Oct-1970

## 2015-11-15 NOTE — Patient Instructions (Signed)
Pt. performed 3# dowel ex. For UE strengthening secondary to weakness. Bilateral shoulder flexion, chest press, circular patterns, and elbow flexion/extension were performed, with ace wrap in place on the left hand.3# dumbbell ex. for elbow flexion and extension,  3# for forearm supination/pronation, wrist flexion/extension, and radial deviation. Pt. requires rest breaks and verbal cues for proper technique. Pt. performed resistive EZ Board exercises for forearm supination/pronation, wrist flexion/extension using gross grasp, and lateral pinch (key) grasp. Pt. performed resistive EZ Board exercises angled in several planes to promote shoulder flexion, abduction, and wrist flexion, and extension while performing resistive wrist flexion and extension with a gross grip. EZboard was placed in overhead positions to incorporate motions used during haircare.Pt. Worked on pinch strengthening in the left hand for 3ptl pinch using resistive clips.Tactlie and verbal cues were required for eliciting the desired movement. Clips placements were angled in overhead positions to incorporate and work on movements used in [employer]. Pt. worked on grasping 1" cubes against resistance alternating thumb to 2nd through 5th digits. Pt. Had cramping with the 5th digit. Pt. Worked on the yellow resistive digiflex to work on alternating movement between the 4th and 5th digits. Pt. worked on grasping coins from a tabletop surface, placing them into a resistive container, and pushing them through the slot while isolating his 2nd digit.

## 2015-11-15 NOTE — Therapy (Signed)
[facility] MAIN [facility] SERVICES [address] [place], [place], [address] Phone: [phone]   Fax:  [phone]  Physical Therapy Treatment  Patient Details  Name: Stephanie Hawkins MRN: [id number] Date of Birth: [birth year] Referring Provider: Dr Sherryll Burger  Encounter Date: 11/15/2015      PT End of Session - 11/15/15 1722    Visit Number 6   Number of Visits 9   Date for PT Re-Evaluation 11/16/15   PT Start Time 1630   PT Stop Time 1715   PT Time Calculation (min) 45 min   Equipment Utilized During Treatment Gait belt   Activity Tolerance Patient tolerated treatment well;Patient limited by fatigue   Behavior During Therapy [facility] for tasks assessed/performed      Past Medical History  Diagnosis Date  . Hypertension   . Stroke [facility])     Past Surgical History  Procedure Laterality Date  . Tubal ligation    . Tee without cardioversion N/A 09/29/2015    Procedure: Transesophageal Echocardiogram (Tee);  Surgeon: Laurier Nancy, MD;  Location: [facility] ORS;  Service: Cardiovascular;  Laterality: N/A;    There were no vitals filed for this visit.  Visit Diagnosis:  Lack of coordination  Weakness generalized      Subjective Assessment - 11/15/15 1722    Subjective pt reports she feels like she is doing much better   Limitations Standing;Walking   How long can you stand comfortably? 10 min stand then needs rest   How long can you walk comfortably? 10 min   Patient Stated Goals walk without AD   Currently in Pain? No/denies         Therex: Treadmill: 3% grade 0.8-1. walking fwd x 2 min, side to side x 1 min, retro x 2 min  Cable column side walk outs 12.5lbs with squats x 3 laps each T ball bridge 2x10 Leg press 75lbs 2x10 BOSU flat top down kneeling with over head ball lift 2x10 Pavlov press kneeling on bosu red band 2x10 each side Fwd/ side BOSU lunge 2x10 each side  Pt requires min verbal and tactile cues for proper exercise  performance                          PT Education - 11/15/15 1722    Education provided Yes   Education Details exercise progression on TM   Person(s) Educated Patient   Methods Explanation   Comprehension Verbalized understanding             PT Long Term Goals - 10/19/15 1815    PT LONG TERM GOAL #1   Title pt will improve 5x STS without UE use to <10 sec to demonstrate increased strength and decreased balance dysfunction   Baseline 18.97s   Time 4   Period Weeks   Status New   PT LONG TERM GOAL #2   Title pt will increase walk speed to 1.2 m/s without use of an AD to be a safe community ambulator   Baseline 0.85 m/s   Time 4   Period Weeks   Status New   PT LONG TERM GOAL #3   Title pt will score >51/56 on the Berg Balance test to demonstrate low fall risk   Baseline 49/56   Time 4   Period Weeks   Status New   PT LONG TERM GOAL #4   Title pt will be able to acend and decend 8 stairs in  step over step pattern without AD use or railing, independently, in order to have access to second story of home   Time 4   Period Weeks   Status New               Plan - 11/15/15 1723    Clinical Impression Statement pt continues to do well with progression of exercises. PT initaited core strengthening as well today which were challenging for her. pt reporting improved function at home.    Pt will benefit from skilled therapeutic intervention in order to improve on the following deficits Abnormal gait;Decreased balance;Decreased endurance;Decreased strength;Difficulty walking;Impaired sensation   Rehab Potential Excellent   PT Frequency 2x / week   PT Duration 4 weeks   PT Treatment/Interventions ADLs/Self Care Home Management;Electrical Stimulation;Iontophoresis 4mg /ml Dexamethasone;Ultrasound;Gait training;Stair training;Functional mobility training;Therapeutic exercise;Balance training;Neuromuscular re-education;Patient/family education;Manual  techniques;Energy conservation   PT Next Visit Plan progression of therapeutic exercise   PT Home Exercise Plan sit to stand, calf raises, lateral band walks, marches   Consulted and Agree with Plan of Care Patient        Problem List Patient Active Problem List   Diagnosis Date Noted  . 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 Hypertensive urgency 09/27/2015  . TIA (transient ischemic attack) 09/27/2015   Carlyon ShadowAshley C. Eliot Popper, PT, DPT [phone]  Arrick Dutton 11/15/2015, 5:24 PM  [place] [facility] [address] [place], [place], [address] Phone: [phone]   Fax:  [phone]  Name: Stephanie BoughLakecia Hawkins MRN: [id number] Date of Birth: 30-Jan-1971

## 2015-11-17 ENCOUNTER — Ambulatory Visit: Payer: BC Managed Care – PPO

## 2015-11-17 ENCOUNTER — Encounter
Admission: RE | Admit: 2015-11-17 | Discharge: 2015-11-17 | Disposition: A | Discharge status: Home / Self Care | Payer: BC Managed Care – PPO | Source: Ambulatory Visit | Attending: Internal Medicine | Admitting: Internal Medicine

## 2015-11-17 ENCOUNTER — Ambulatory Visit: Payer: BC Managed Care – PPO | Admitting: Occupational Therapy

## 2015-11-17 DIAGNOSIS — E0590 Thyrotoxicosis, unspecified without thyrotoxic crisis or storm: Secondary | ICD-10-CM | POA: Insufficient documentation

## 2015-11-17 DIAGNOSIS — R531 Weakness: Secondary | ICD-10-CM

## 2015-11-17 DIAGNOSIS — R279 Unspecified lack of coordination: Secondary | ICD-10-CM

## 2015-11-17 MED ORDER — SODIUM IODIDE I-123 3.7 MBQ PO CAPS
161.2000 | ORAL_CAPSULE | Freq: Once | ORAL | Status: AC
  Administered 2015-11-17: 161.2 via ORAL

## 2015-11-17 NOTE — Therapy (Signed)
[place] MAIN [facility] SERVICES [address] [place], [place], [address] Phone: [phone]   Fax:  [phone]  Physical Therapy Treatment /progress note  Patient Details  Name: Stephanie Hawkins MRN: [id number] Date of Birth: 1971-01-13 Referring Provider: Dr Manuella Ghazi  Encounter Date: 11/17/2015      PT End of Session - 11/17/15 1705    Visit Number 7   Number of Visits 17   Date for PT Re-Evaluation 12/15/15   PT Start Time 1630   PT Stop Time 5732   PT Time Calculation (min) 45 min   Equipment Utilized During Treatment Gait belt   Activity Tolerance Patient tolerated treatment well;Patient limited by fatigue   Behavior During Therapy [facility] for tasks assessed/performed      Past Medical History  Diagnosis Date  . Hypertension   . Stroke [facility])     Past Surgical History  Procedure Laterality Date  . Tubal ligation    . Tee without cardioversion N/A 09/29/2015    Procedure: Transesophageal Echocardiogram (Tee);  Surgeon: Dionisio David, MD;  Location: [facility] ORS;  Service: Cardiovascular;  Laterality: N/A;    There were no vitals filed for this visit.  Visit Diagnosis:  Lack of coordination - Plan: PT plan of care cert/re-cert  Weakness generalized - Plan: PT plan of care cert/re-cert      Subjective Assessment - 11/17/15 1704    Subjective pt reports she is feeling much better, but still has some things she would like to be better.    Limitations Standing;Walking   How long can you stand comfortably? 10 min stand then needs rest   How long can you walk comfortably? 10 min   Patient Stated Goals walk without AD   Currently in Pain? No/denies    Therex:  Leg press 120lbs 3x10 PT assessed outcome measures and progress towards goals as follows:         [facility] PT Assessment - 11/17/15 0001    Standardized Balance Assessment   Standardized Balance Assessment Berg Balance Test;10 meter walk test;Five Times Sit to Stand   Five times  sit to stand comments  14.21   10 Meter Walk 1.49ms   Berg Balance Test   Sit to Stand Able to stand without using hands and stabilize independently   Standing Unsupported Able to stand safely 2 minutes   Sitting with Back Unsupported but Feet Supported on Floor or Stool Able to sit safely and securely 2 minutes   Stand to Sit Sits safely with minimal use of hands   Transfers Able to transfer safely, minor use of hands   Standing Unsupported with Eyes Closed Able to stand 10 seconds safely   Standing Ubsupported with Feet Together Able to place feet together independently and stand 1 minute safely   From Standing, Reach Forward with Outstretched Arm Can reach confidently >25 cm (10")   From Standing Position, Pick up Object from Floor Able to pick up shoe safely and easily   From Standing Position, Turn to Look Behind Over each Shoulder Looks behind from both sides and weight shifts well   Turn 360 Degrees Able to turn 360 degrees safely in 4 seconds or less   Standing Unsupported, Alternately Place Feet on Step/Stool Able to stand independently and safely and complete 8 steps in 20 seconds   Standing Unsupported, One Foot in Front Able to place foot tandem independently and hold 30 seconds   Standing on One Leg Able to lift leg  independently and hold > 10 seconds   Total Score 56      6 min walk test: 1116f                       PT Education - 11/17/15 1704    Education provided Yes   Education Details progress towards goals and POC             PT Long Term Goals - 11/17/15 1706    PT LONG TERM GOAL #1   Title pt will improve 5x STS without UE use to <10 sec to demonstrate increased strength and decreased balance dysfunction   Baseline 18.97s /14.21   Time 4   Period Weeks   Status New   PT LONG TERM GOAL #2   Title pt will increase walk speed to 1.2 m/s without use of an AD to be a safe community ambulator   Baseline 0.85 m/s   Time 4   Period Weeks    Status Achieved   PT LONG TERM GOAL #3   Title pt will score >51/56 on the Berg Balance test to demonstrate low fall risk   Time 4   Period Weeks   Status Achieved   PT LONG TERM GOAL #4   Title pt will be able to acend and decend 8 stairs in step over step pattern without AD use or railing, independently, in order to have access to second story of home   Time 4   Period Weeks   Status Achieved   PT LONG TERM GOAL #5   Title pt will be able to carry a bag of groceries in both hands and ascend and descend 8 steps in step over step pattern safely    Time 4   Period Weeks   Status New   Additional Long Term Goals   Additional Long Term Goals Yes   PT LONG TERM GOAL #6   Title pt will improve 6 min walk test by 1532fin order to imNordstromobility.    Baseline 119037f Time 4   Period Weeks   Status New               Plan - 11/17/15 1705    Clinical Impression Statement pt has met majority of PT goals at this time and has made great progress since starting therapy. pt would benefit from continued skilled PT services to maximize function.    Pt will benefit from skilled therapeutic intervention in order to improve on the following deficits Abnormal gait;Decreased balance;Decreased endurance;Decreased strength;Difficulty walking;Impaired sensation   Rehab Potential Excellent   PT Frequency 2x / week   PT Duration 4 weeks   PT Treatment/Interventions ADLs/Self Care Home Management;Electrical Stimulation;Iontophoresis 4mg23m Dexamethasone;Ultrasound;Gait training;Stair training;Functional mobility training;Therapeutic exercise;Balance training;Neuromuscular re-education;Patient/family education;Manual techniques;Energy conservation   PT Next Visit Plan progression of therapeutic exercise   PT Home Exercise Plan sit to stand, calf raises, lateral band walks, marches   Consulted and Agree with Plan of Care Patient        Problem List Patient Active Problem List    Diagnosis Date Noted  . Cerebrovascular accident (CVA) due to stenosis of cerebral artery (HCC)[place]. Hypertensive urgency 09/27/2015  . TIA (transient ischemic attack) 09/27/2015   AshlGorden Harmsrtorici, PT, DPT #[id number],Harnoor Reta 11/17/2015, 5:10 PM  [place] [facility] [id number], [place]: [phone]:  [phone]  Name: Wyoma Genson MRN: [id number] Date of Birth: 1971-07-30

## 2015-11-17 NOTE — Therapy (Signed)
[place] [facility] MAIN [facility] SERVICES [address] [place], [place], [address] Phone: [phone]   Fax:  [phone]  Occupational Therapy Treatment  Patient Details  Name: Stephanie Hawkins MRN: [id number] Date of Birth: November 28, 1970 Referring Provider: Sherryll Burger  Encounter Date: 11/17/2015      OT End of Session - 11/17/15 1742    Visit Number 10   Number of Visits 16   Date for OT Re-Evaluation 12/07/15   OT Start Time 1545   OT Stop Time 1630   OT Time Calculation (min) 45 min   Activity Tolerance Patient tolerated treatment well      Past Medical History  Diagnosis Date  . Hypertension   . Stroke [facility])     Past Surgical History  Procedure Laterality Date  . Tubal ligation    . Tee without cardioversion N/A 09/29/2015    Procedure: Transesophageal Echocardiogram (Tee);  Surgeon: Laurier Nancy, MD;  Location: [facility] ORS;  Service: Cardiovascular;  Laterality: N/A;    There were no vitals filed for this visit.  Visit Diagnosis:  Lack of coordination  Weakness generalized      Subjective Assessment - 11/17/15 1738    Subjective  Pt. had an an imaging test for her Thyroid today.   Patient is accompained by: Family member   Pertinent History Stephanie Hawkins is a 45 y.o. female with a known history of hypertension admitted to [facility] on 09/27/2015 with left lower extremity weakness. Patient woke up that morning was trying to go to the bathroom to brush her teeth. She felt weak in her left leg and also noticed some weakness in the left upper extremity. Patient says her left leg was weaker than the left upper extremity when she first noticed it. No history of any slurred speech. Patient had not been taking blood pressure medication for the last 1 year. Has not followed up with any primary care physician for the last few years. When patient presented to the emergency room her blood pressure was high and systolic blood pressure was more than 210 mm of Hg and  diastolic pressure was around 160 mm of Hg. Initial CT scan of the head did not show any intracranial abnormality. No history of any headache dizziness or blurry vision. No history of any fall or any seizures. Pt reports full independence with community mobility prior to arrival. 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 Brain MRI showed small acute lacunar CVA R posterior corona radiata to R putamen as well as chronic L CVA. Since discharge she has not been able to perform higher level IADL tasks such as laundry, cooking, cleaning.  She attempted to perform laundry and had to sit to fold clothes. Her church family has been bringing in meals.    Limitations Standing IADL tasks, UE strength and impaired coordination and hand function.   Patient Stated Goals Patient wants to be independent with all tasks and return to work as soon as possible.    Currently in Pain? No/denies   Pain Score 0-No pain                      OT Treatments/Exercises (OP) - 11/17/15 0001    Fine Motor Coordination   Other Fine Motor Exercises Pt. worked on fixing eyeglasses with a small screwdriver screwing and unscrewing tiny screws with her left hand. Pt. worked on grasping 1/16" beads and placing them  on a wire with the left hand   Neurological Re-education Exercises   Other Exercises 1 Pt. performed 3# dowel ex. For UE strengthening secondary to weakness. Bilateral shoulder flexion, chest press, circular patterns, and elbow flexion/extension were performed, with ace wrap in place on the left hand. 3# dumbbell ex. for elbow flexion and extension,  3# for forearm supination/pronation, wrist flexion/extension, and radial deviation. Pt. requires rest breaks and verbal cues for proper technique. Pt. performed gross gripping with grip strengthener. Pt. worked on sustaining grip while grasping pegs and reaching at various heights. Performed in standing with cues to  widen BOS.Pt. Worked on pinch strengthening in the left hand for lateral, 3pt., and 2pt. pinch using yellow, red, green, and blue resistive clips. Emphasis was placed on changing the position of clip placement to incorporate/simulate overhead movements used in [employer]                OT Education - 11/17/15 1742    Education provided Yes   Person(s) Educated Patient   Methods Explanation;Demonstration;Verbal cues   Comprehension Verbalized understanding;Returned demonstration             OT Long Term Goals - 10/13/15 1112    OT LONG TERM GOAL #1   Title Patient will improve LUE strength by 1 mm grade to demonstrate lifting of pot filled with water to be independent with cooking.   Baseline unable to lift pots at evaluation especially if filled.    Time 8   Period Weeks   Status New   OT LONG TERM GOAL #2   Title Patient will improve LUE grip and strength sufficient to carry full laundry basket of clothes safely to laundry room using BUE.     Baseline unable at evaluation.   Time 8   Period Weeks   Status New   OT LONG TERM GOAL #3   Title Patient will be able to carry light weight items up and down stairs with modified technique and good balance.    Baseline unable at evaluation   Time 8   Period Weeks   Status New   OT LONG TERM GOAL #4   Title Patient will be able to stand up to 20 minutes to wash dishes and/or fold laundry.     Baseline has to sit to complete laundry folding.   Time 8   Period Weeks   Status New   OT LONG TERM GOAL #5   Title Patient will demonstrate typing of 3 paragraphs in 15 minutes with less than 5 errors.    Baseline slow to type   Time 8   Period Weeks   Status New   Long Term Additional Goals   Additional Long Term Goals Yes   OT LONG TERM GOAL #6   Title Patient will increase grip strength on L hand by 15 pounds to be able to open jars and containers.    Baseline right grip 66#, left 39# at eval   Time 8   Period Weeks    Status New               Problem List Patient Active Problem List   Diagnosis Date Noted  . 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 Hypertensive urgency 09/27/2015  . TIA (transient ischemic attack) 09/27/2015   Stephanie MessierElaine Gaetano Romberger, MS, OTR/L  Stephanie Hawkins 11/17/2015, 5:51 PM  [place] [facility] MAIN [facility] [address] [place],  [place], [address] Phone: [phone]   Fax:  [phone]  Name: Stephanie Hawkins MRN: [id number] Date of Birth: March 29, 1971

## 2015-11-17 NOTE — Patient Instructions (Signed)
Pt. worked on Hess Corporationfixing eyeglasses with a small screwdriver screwing and unscrewing tiny screws with her left hand. Pt. worked on grasping 1/16" beads and placing them on a wire with the left hand.  Pt. performed 3# dowel ex. For UE strengthening secondary to weakness. Bilateral shoulder flexion, chest press, circular patterns, and elbow flexion/extension were performed, with ace wrap in place on the left hand. 3# dumbbell ex. for elbow flexion and extension,  3# for forearm supination/pronation, wrist flexion/extension, and radial deviation. Pt. requires rest breaks and verbal cues for proper technique. Pt. performed gross gripping with grip strengthener. Pt. worked on sustaining grip while grasping pegs and reaching at various heights. Performed in standing with cues to widen BOS.Pt. Worked on pinch strengthening in the left hand for lateral, 3pt., and 2pt. pinch using yellow, red, green, and blue resistive clips. Emphasis was placed on changing the position of clip placement to incorporate/simulate overhead movements used in haircare.

## 2015-11-18 ENCOUNTER — Encounter: Admission: RE | Admit: 2015-11-18 | Payer: BC Managed Care – PPO | Source: Ambulatory Visit

## 2015-11-21 ENCOUNTER — Encounter: Payer: BC Managed Care – PPO | Admitting: Occupational Therapy

## 2015-11-22 ENCOUNTER — Ambulatory Visit: Payer: BC Managed Care – PPO

## 2015-11-22 ENCOUNTER — Encounter: Payer: Self-pay | Admitting: Occupational Therapy

## 2015-11-22 ENCOUNTER — Ambulatory Visit: Payer: BC Managed Care – PPO | Admitting: Occupational Therapy

## 2015-11-22 DIAGNOSIS — R279 Unspecified lack of coordination: Secondary | ICD-10-CM

## 2015-11-22 DIAGNOSIS — M6281 Muscle weakness (generalized): Secondary | ICD-10-CM

## 2015-11-22 DIAGNOSIS — R531 Weakness: Secondary | ICD-10-CM

## 2015-11-22 NOTE — Therapy (Signed)
[facility] MAIN [facility] SERVICES [address] [place], [place], [address] Phone: [phone]   Fax:  [phone]  Physical Therapy Treatment  Patient Details  Name: Stephanie Hawkins MRN: [id number] Date of Birth: 30-Mar-1971 Referring Provider: Dr Sherryll Burger  Encounter Date: 11/22/2015      PT End of Session - 11/22/15 1701    Visit Number 8   Number of Visits 17   Date for PT Re-Evaluation 12/15/15   PT Start Time 1645   PT Stop Time 1730   PT Time Calculation (min) 45 min   Equipment Utilized During Treatment Gait belt   Activity Tolerance Patient tolerated treatment well;Patient limited by fatigue   Behavior During Therapy [facility] for tasks assessed/performed      Past Medical History  Diagnosis Date  . Hypertension   . Stroke [facility])     Past Surgical History  Procedure Laterality Date  . Tubal ligation    . Tee without cardioversion N/A 09/29/2015    Procedure: Transesophageal Echocardiogram (Tee);  Surgeon: Laurier Nancy, MD;  Location: [facility] ORS;  Service: Cardiovascular;  Laterality: N/A;    There were no vitals filed for this visit.  Visit Diagnosis:  Lack of coordination  Weakness generalized      Subjective Assessment - 11/22/15 1700    Subjective pt reports doing short online exercise sessions and her walking program    Limitations Standing;Walking   How long can you stand comfortably? 10 min stand then needs rest   How long can you walk comfortably? 10 min   Patient Stated Goals walk without AD   Currently in Pain? No/denies     Therex: Rec bike L 2 x 3 min warm up Tball squat 3 s hold x 10 TKE with ball on wall x 20 Side step with redband 30 ft R and L Monster walk with red band 65ft x 2 laps fwd/ retro  Eccentric side step down 4in 4x5  Grape vine 70ft x 2 laps Squat with 4inch step over 2x10 Prone alt LE extension x 15 Pt requires min verbal and tactile cues for proper exercise performance                                   PT Long Term Goals - 11/17/15 1706    PT LONG TERM GOAL #1   Title pt will improve 5x STS without UE use to <10 sec to demonstrate increased strength and decreased balance dysfunction   Baseline 18.97s /14.21   Time 4   Period Weeks   Status New   PT LONG TERM GOAL #2   Title pt will increase walk speed to 1.2 m/s without use of an AD to be a safe community ambulator   Baseline 0.85 m/s   Time 4   Period Weeks   Status Achieved   PT LONG TERM GOAL #3   Title pt will score >51/56 on the Berg Balance test to demonstrate low fall risk   Time 4   Period Weeks   Status Achieved   PT LONG TERM GOAL #4   Title pt will be able to acend and decend 8 stairs in step over step pattern without AD use or railing, independently, in order to have access to second story of home   Time 4   Period Weeks   Status Achieved   PT LONG TERM GOAL #5   Title  pt will be able to carry a bag of groceries in both hands and ascend and descend 8 steps in step over step pattern safely    Time 4   Period Weeks   Status New   Additional Long Term Goals   Additional Long Term Goals Yes   PT LONG TERM GOAL #6   Title pt will improve 6 min walk test by 1950ft in order to [employer] community mobility.    Baseline 115690ft   Time 4   Period Weeks   Status New               Plan - 11/22/15 1703    Clinical Impression Statement pt did well with progression of strengthening and conditioning. pt has improved L knee control with TKEs and during ambulation    Pt will benefit from skilled therapeutic intervention in order to improve on the following deficits Abnormal gait;Decreased balance;Decreased endurance;Decreased strength;Difficulty walking;Impaired sensation   Rehab Potential Excellent   PT Frequency 2x / week   PT Duration 4 weeks   PT Treatment/Interventions ADLs/Self Care Home Management;Electrical Stimulation;Iontophoresis 4mg /ml  Dexamethasone;Ultrasound;Gait training;Stair training;Functional mobility training;Therapeutic exercise;Balance training;Neuromuscular re-education;Patient/family education;Manual techniques;Energy conservation   PT Next Visit Plan progression of therapeutic exercise   PT Home Exercise Plan sit to stand, calf raises, lateral band walks, marches   Consulted and Agree with Plan of Care Patient        Problem List Patient Active Problem List   Diagnosis Date Noted  . 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 Hypertensive urgency 09/27/2015  . TIA (transient ischemic attack) 09/27/2015   Carlyon ShadowAshley C. Elfrida Pixley, PT, DPT [phone]  Jynesis Nakamura 11/22/2015, 5:04 PM  [place] [facility] [address] [place], [place], [address] Phone: [phone]   Fax:  [phone]  Name: Stephanie BoughLakecia Hawkins MRN: [id number] Date of Birth: 1971-03-31

## 2015-11-22 NOTE — Therapy (Signed)
[place] [facility] [address] [place], [place], [address] Phone: [phone]   Fax:  [phone]  Occupational Therapy Treatment  Patient Details  Name: Stephanie BoughLakecia Hawkins MRN: [id number] Date of Birth: 04/04/1971 Referring Provider: Sherryll BurgerShah  Encounter Date: 11/22/2015      OT End of Session - 11/22/15 1727    Visit Number 11   Number of Visits 16   Date for OT Re-Evaluation 12/07/15   OT Start Time 1600   OT Stop Time 1645   OT Time Calculation (min) 45 min   Activity Tolerance Patient tolerated treatment well   Behavior During Therapy [facility] for tasks assessed/performed      Past Medical History  Diagnosis Date  . Hypertension   . Stroke [facility](HCC)     Past Surgical History  Procedure Laterality Date  . Tubal ligation    . Tee without cardioversion N/A 09/29/2015    Procedure: Transesophageal Echocardiogram (Tee);  Surgeon: Laurier NancyShaukat A Khan, MD;  Location: [facility] ORS;  Service: Cardiovascular;  Laterality: N/A;    There were no vitals filed for this visit.  Visit Diagnosis:  Lack of coordination  Muscle weakness (generalized)      Subjective Assessment - 11/22/15 1718    Subjective  Pt stated she was really tired and fell asleep and was why she was late getting to appointment today.   Pertinent History Stephanie BoughLakecia Hawkins is a 45 y.o. female with a known history of [facility] with left lower extremity weakness. Patient woke up that morning was trying to go to the bathroom to brush her teeth. She felt weak in her left leg and also noticed some weakness in the left upper extremity. Patient says her left leg was weaker than the left upper extremity when she first noticed it. No history of any slurred speech. Patient had not been taking blood pressure medication for the last 1 year. Has not followed up with any primary care physician for the last few years. When patient presented to the emergency room her blood  pressure was high and systolic blood pressure was more than 210 mm of Hg and diastolic pressure was around 160 mm of Hg. Initial CT scan of the head did not show any intracranial abnormality. No history of any headache dizziness or blurry vision. No history of any fall or any seizures. Pt reports full independence with community mobility prior to arrival. 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 Brain MRI showed small acute lacunar CVA R posterior corona radiata to R putamen as well as chronic L CVA. Since discharge she has not been able to perform higher level IADL tasks such as laundry, cooking, cleaning.  She attempted to perform laundry and had to sit to fold clothes. Her church family has been bringing in meals.    Limitations Standing IADL tasks, UE strength and impaired coordination and hand function.   Patient Stated Goals Patient wants to be independent with all tasks and return to work as soon as possible.    Currently in Pain? No/denies   Pain Score 0-No pain                      OT Treatments/Exercises (OP) - 11/22/15 0001    Fine Motor Coordination   Fine Motor Coordination In hand manipuation training;Manipulation of small objects;Nuts and Bolts   In Materials engineerHand Manipulation Training --  pt making progress in moving small objects from palm to fing   Other Fine Motor Exercises Pt worked with left hand with emphasis on ring and 4th and 5th digits of left for manipulating nuts and bolts.  Cues to keep elbow down and not substitute with elbow winging and elevation.  Several rest breaks needed to stretch fingers and demonstrated to patient how to do tendon gliding exercises and palms together with shoulder abduction to stretch especially when using computer. Pt abe to demonstrate teach back.    Neurological Re-education Exercises   Other Exercises 1 Pt performed resistive exercises with 3# weight to dowel for wrist flexion and  extension for 4 sets of 10 with cues to breathe and count.  Educated in using her vision to watch movement in R hand compared to L to make adjustments on wrist position.                OT Education - 11/22/15 1726    Education provided Yes   Education Details progress towards goals and POC   Person(s) Educated Patient   Methods Explanation;Demonstration;Verbal cues   Comprehension Verbalized understanding;Returned demonstration             OT Long Term Goals - 10/13/15 1112    OT LONG TERM GOAL #1   Title Patient will improve LUE strength by 1 mm grade to demonstrate lifting of pot filled with water to be independent with cooking.   Baseline unable to lift pots at evaluation especially if filled.    Time 8   Period Weeks   Status New   OT LONG TERM GOAL #2   Title Patient will improve LUE grip and strength sufficient to carry full laundry basket of clothes safely to laundry room using BUE.     Baseline unable at evaluation.   Time 8   Period Weeks   Status New   OT LONG TERM GOAL #3   Title Patient will be able to carry light weight items up and down stairs with modified technique and good balance.    Baseline unable at evaluation   Time 8   Period Weeks   Status New   OT LONG TERM GOAL #4   Title Patient will be able to stand up to 20 minutes to wash dishes and/or fold laundry.     Baseline has to sit to complete laundry folding.   Time 8   Period Weeks   Status New   OT LONG TERM GOAL #5   Title Patient will demonstrate typing of 3 paragraphs in 15 minutes with less than 5 errors.    Baseline slow to type   Time 8   Period Weeks   Status New   Long Term Additional Goals   Additional Long Term Goals Yes   OT LONG TERM GOAL #6   Title Patient will increase grip strength on L hand by 15 pounds to be able to open jars and containers.    Baseline right grip 66#, left 39# at eval   Time 8   Period Weeks   Status New               Plan - 11/22/15  1727    Clinical Impression Statement Pt continues to be motivated to increase functional use of L Ue and hand.  She is making good progress with fine motor coordination skills but needs breaks and time to stretch to keep hand from cramping.     Pt will  benefit from skilled therapeutic intervention in order to improve on the following deficits (Retired) Decreased endurance;Decreased activity tolerance;Decreased knowledge of use of DME;Decreased strength;Decreased balance;Decreased mobility;Pain;Decreased coordination;Impaired UE functional use   Rehab Potential Excellent   OT Frequency 2x / week   OT Duration 8 weeks   OT Treatment/Interventions Self-care/ADL training;Therapeutic exercise;Functional Mobility Training;Patient/family education;Neuromuscular education;Manual Therapy;Balance training;Therapeutic exercises;DME and/or AE instruction;Therapeutic activities   Consulted and Agree with Plan of Care Patient        Problem List Patient Active Problem List   Diagnosis Date Noted  . 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 Hypertensive urgency 09/27/2015  . TIA (transient ischemic attack) 09/27/2015    Wofford,Susan 11/22/2015, 5:31 PM  [place] [facility] MAIN [facility] SERVICES [address] [place], [place], [address] Phone: [phone]   Fax:  [phone]  Name: Jacee Enerson MRN: [id number] Date of Birth: 09/16/1970   Susanne Borders, OTR/L ascom [phone]

## 2015-11-22 NOTE — Patient Instructions (Signed)
Pt instructed on how to decrease shoulder elevation and substitution pattern when L UE and hand start to fatigue. Pt able to demonstrate back new tendon gliding exercises and stretch to hands.

## 2015-11-24 ENCOUNTER — Ambulatory Visit: Payer: BC Managed Care – PPO | Admitting: Physical Therapy

## 2015-11-24 ENCOUNTER — Ambulatory Visit: Payer: BC Managed Care – PPO | Admitting: Occupational Therapy

## 2015-11-29 ENCOUNTER — Ambulatory Visit: Payer: BC Managed Care – PPO | Admitting: Occupational Therapy

## 2015-11-29 ENCOUNTER — Ambulatory Visit: Payer: BC Managed Care – PPO

## 2015-11-29 DIAGNOSIS — R29810 Facial weakness: Secondary | ICD-10-CM

## 2015-11-29 DIAGNOSIS — R278 Other lack of coordination: Secondary | ICD-10-CM

## 2015-11-29 DIAGNOSIS — M6281 Muscle weakness (generalized): Secondary | ICD-10-CM

## 2015-11-29 DIAGNOSIS — R279 Unspecified lack of coordination: Secondary | ICD-10-CM | POA: Diagnosis not present

## 2015-11-29 NOTE — Therapy (Signed)
[place] MAIN [facility] SERVICES [address] [place], [place], [address] Phone: [phone]   Fax:  [phone]  Occupational Therapy Treatment/Progress Note  Patient Details  Name: Stephanie Hawkins MRN: [id number] Date of Birth: [birth year] Referring Provider: Manuella Ghazi  Encounter Date: 11/29/2015      OT End of Session - 11/29/15 1656    Visit Number 12   Number of Visits 16   Date for OT Re-Evaluation 12/07/15   OT Start Time 1600   OT Stop Time 1650   OT Time Calculation (min) 50 min   Activity Tolerance Patient tolerated treatment well   Behavior During Therapy [facility] for tasks assessed/performed      Past Medical History  Diagnosis Date  . Hypertension   . Stroke [facility])     Past Surgical History  Procedure Laterality Date  . Tubal ligation    . Tee without cardioversion N/A 09/29/2015    Procedure: Transesophageal Echocardiogram (Tee);  Surgeon: Dionisio David, MD;  Location: [facility] ORS;  Service: Cardiovascular;  Laterality: N/A;    There were no vitals filed for this visit.  Visit Diagnosis:  Facial weakness  Other lack of coordination      Subjective Assessment - 11/29/15 1603    Subjective  Pt. has diificulty carrying items without losing her balance.   Patient is accompained by: Family member   Pertinent History Stephanie Hawkins is a 45 y.o. female with a known history of hypertension admitted [facility] on 09/27/2015 with left lower extremity weakness. Patient woke up that morning was trying to go to the bathroom to brush her teeth. She felt weak in her left leg and also noticed some weakness in the left upper extremity. Patient says her left leg was weaker than the left upper extremity when she first noticed it. No history of any slurred speech. Patient had not been taking blood pressure medication for the last 1 year. Has not followed up with any primary care physician for the last few years. When patient presented to the emergency room her  blood pressure was high and systolic blood pressure was more than 210 mm of Hg and diastolic pressure was around 160 mm of Hg. Initial CT scan of the head did not show any intracranial abnormality. No history of any headache dizziness or blurry vision. No history of any fall or any seizures. Pt reports full independence with community mobility prior to arrival. 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 Brain MRI showed small acute lacunar CVA R posterior corona radiata to R putamen as well as chronic L CVA. Since discharge she has not been able to perform higher level IADL tasks such as laundry, cooking, cleaning.  She attempted to perform laundry and had to sit to fold clothes. Her church family has been bringing in meals.    Limitations Standing IADL tasks, UE strength and impaired coordination and hand function.   Patient Stated Goals Patient wants to be independent with all tasks and return to work as soon as possible.             [facility] OT Assessment - 11/29/15 1609    Coordination   Left 9 Hole Peg Test 28 sec.   Strength   Overall Strength Comments Left UE shoulder flexion 4/5, abduction 4+/5, elbow flexion/extension 5/5, wrist 4+/5   Hand Function   Left Hand Grip (lbs) 40   Left Hand Lateral Pinch 17  lbs   Left 3 point pinch 10 lbs        OT TREATMENT   Therapeutic Exercise: Pt. performed gross gripping with grip strengthener. Pt. worked on sustaining grip while grasping pegs and reaching at various heights. Performed in sitting and standing with more difficulty maintaining grip in standing. Pt. presented with left LE weakness. Pt. worked on gross gripping. With the left hand using a variety of grippers.  Selfcare: Reviewed pt. goals with pt. Pt. Continues to have difficulty maintaining grip, and holding ADL items while standing.                    OT Education - 11/29/15 1720    Education provided Yes    Person(s) Educated Patient   Methods Explanation;Demonstration   Comprehension Verbalized understanding;Returned demonstration             OT Long Term Goals - 11/29/15 1616    OT LONG TERM GOAL #1   Title Patient will improve LUE strength by 1 mm grade to demonstrate lifting of pot filled with water to be independent with cooking.   Baseline unable to lift pots at evaluation especially if filled.    Status Partially Met   OT LONG TERM GOAL #2   Status Not Met   OT LONG TERM GOAL #3   Status Not Met   OT LONG TERM GOAL #4   Status Not Met   OT LONG TERM GOAL #5   Status Not Met   OT LONG TERM GOAL #6   Title Patient will increase grip strength on L hand by 3 pounds to be able to open jars and containers.    Baseline right grip 66#, left 39# at eval   Time 8   Period Weeks   Status Revised   OT LONG TERM GOAL #7   Title Pt. will improve Left UE control to be able to independently pick up items off the floor quickly.   Baseline Pt. has difficulty.   Time 8   Period Weeks   Status New   OT LONG TERM GOAL #8   Title Pt. will independently perfrom hair care while sustaining left UE in elevation the full duration without rest breaks.   Baseline Pt. left UE fatigues   Time 8   Period Weeks   Status New               Plan - 11/29/15 1656    Clinical Impression Statement Pt. has improved with left UE strength,  grip and pinch strength, and coordination skills. Pt. continues to have difficulty with sustaining her left UE in eleavation, and sustaining her grip during ADL tasks such as reaching into cabinets, and performing hair care.  Pt. also continues to have difficulty with activity tolerance during tasks in standing  while using her left UE. Pt. continues to require skilled OT services to improve ADL/IADL functioning.   Pt will benefit from skilled therapeutic intervention in order to improve on the following deficits (Retired) Decreased endurance;Decreased activity  tolerance;Decreased knowledge of use of DME;Decreased strength;Decreased balance;Decreased mobility;Pain;Decreased coordination;Impaired UE functional use   Rehab Potential Excellent   OT Frequency 2x / week   OT Duration 8 weeks   OT Treatment/Interventions Self-care/ADL training;Therapeutic exercise;Functional Mobility Training;Patient/family education;Neuromuscular education;Manual Therapy;Balance training;Therapeutic exercises;DME and/or AE instruction;Therapeutic activities   Consulted and Agree with Plan of Care Patient        Problem List Patient Active Problem List   Diagnosis Date Noted  .  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 Hypertensive urgency 09/27/2015  . TIA (transient ischemic attack) 09/27/2015   Harrel Carina, MS, OTR/L   Harrel Carina 11/29/2015, 5:23 PM  [place] MAIN [facility] SERVICES [address] [place], [place], [address] Phone: [phone]   Fax:  [phone]  Name: Darlys Buis MRN: [id number] Date of Birth: 1970-11-09

## 2015-11-29 NOTE — Patient Instructions (Addendum)
OT TREATMENT   Therapeutic Exercise: Pt. performed gross gripping with grip strengthener. Pt. worked on sustaining grip while grasping pegs and reaching at various heights. Performed in sitting and standing with more difficulty maintaining grip in standing. Pt. presented with left LE weakness. Pt. worked on gross gripping. With the left hand using a variety of grippers.  Selfcare: Reviewed pt. goals with pt. Pt. Continues to have difficulty maintaining grip, and holding ADL items while standing.

## 2015-11-30 NOTE — Therapy (Signed)
[place] [facility] [address] [place], [place], [address] Phone: [phone]   Fax:  [phone]  Physical Therapy Treatment  Patient Details  Name: Stephanie Hawkins MRN: [id number] Date of Birth: 08-Jul-1971 Referring Provider: Dr Sherryll BurgerShah  Encounter Date: 11/29/2015      PT End of Session - 11/29/15 1729    Visit Number 9   Number of Visits 17   Date for PT Re-Evaluation 12/15/15   PT Start Time 1645   PT Stop Time 1730   PT Time Calculation (min) 45 min   Equipment Utilized During Treatment Gait belt   Activity Tolerance Patient tolerated treatment well;Patient limited by fatigue   Behavior During Therapy [facility] for tasks assessed/performed      Past Medical History  Diagnosis Date  . Hypertension   . Stroke [facility](HCC)     Past Surgical History  Procedure Laterality Date  . Tubal ligation    . Tee without cardioversion N/A 09/29/2015    Procedure: Transesophageal Echocardiogram (Tee);  Surgeon: Laurier NancyShaukat A Khan, MD;  Location: [facility] ORS;  Service: Cardiovascular;  Laterality: N/A;    There were no vitals filed for this visit.  Visit Diagnosis:  Muscle weakness (generalized)      Subjective Assessment - 11/29/15 1728    Subjective pt reports her L knee feels weak today    Limitations Standing;Walking   How long can you stand comfortably? 10 min stand then needs rest   How long can you walk comfortably? 10 min   Patient Stated Goals walk without AD   Currently in Pain? No/denies        Therex: Rec bike x 2 min warm up Side stepping with red band 7430ft x 2 laps Monster walk with red band around knees 5930ft x 2 laps Weight shift in staggered stance with LLE behind x 20 Weight shift in staggered stance with LLE behind with R knee raise into SLS with R arm up x 20 Standing on BOSU 1 min x 3 , then mini squat on BOSU x 7 (pt tired) Standing resisted hip flexion, abduction ,adduction and extension with   red   Band x 10 each  way each leg BOSU lunge x 10 each leg Pt requires min verbal and tactile cues for proper exercise performance  . Cues also to keep upright gaze.                          PT Education - 11/29/15 1729    Education provided Yes   Education Details staggered stance L foot behind weight shift with R ankle DF   Person(s) Educated Patient   Methods Explanation   Comprehension Verbalized understanding             PT Long Term Goals - 11/17/15 1706    PT LONG TERM GOAL #1   Title pt will improve 5x STS without UE use to <10 sec to demonstrate increased strength and decreased balance dysfunction   Baseline 18.97s /14.21   Time 4   Period Weeks   Status New   PT LONG TERM GOAL #2   Title pt will increase walk speed to 1.2 m/s without use of an AD to be a safe community ambulator   Baseline 0.85 m/s   Time 4   Period Weeks   Status Achieved   PT LONG TERM GOAL #3   Title pt will score >51/56 on the [employer] Balance  test to demonstrate low fall risk   Time 4   Period Weeks   Status Achieved   PT LONG TERM GOAL #4   Title pt will be able to acend and decend 8 stairs in step over step pattern without AD use or railing, independently, in order to have access to second story of home   Time 4   Period Weeks   Status Achieved   PT LONG TERM GOAL #5   Title pt will be able to carry a bag of groceries in both hands and ascend and descend 8 steps in step over step pattern safely    Time 4   Period Weeks   Status New   Additional Long Term Goals   Additional Long Term Goals Yes   PT LONG TERM GOAL #6   Title pt will improve 6 min walk test by 166ft in order to [employer] mobility.    Baseline 1123ft   Time 4   Period Weeks   Status New               Plan - 11/30/15 1038    Clinical Impression Statement pt seemed generally tired during session today with lower energy. pt progressed HEP to include improved neuromuscular control of the L quad with wegith  shifitng. progressed balance exercises as well today having pt stand on th eBOSU   Pt will benefit from skilled therapeutic intervention in order to improve on the following deficits Abnormal gait;Decreased balance;Decreased endurance;Decreased strength;Difficulty walking;Impaired sensation   Rehab Potential Excellent   PT Frequency 2x / week   PT Duration 4 weeks   PT Treatment/Interventions ADLs/Self Care Home Management;Electrical Stimulation;Iontophoresis /ml Dexamethasone;Ultrasound;Gait training;Stair training;Functional mobility training;Therapeutic exercise;Balance training;Neuromuscular re-education;Patient/family education;Manual techniques;Energy conservation   PT Next Visit Plan progression of therapeutic exercise   PT Home Exercise Plan sit to stand, calf raises, lateral band walks, marches   Consulted and Agree with Plan of Care Patient        Problem List Patient Active Problem List   Diagnosis Date Noted  . 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 Hypertensive urgency 09/27/2015  . TIA (transient ischemic attack) 09/27/2015   Carlyon Shadow. Dantonio Justen, PT, DPT [phone]  Deshane Cotroneo 11/30/2015, 10:40 AM  [place] [facility] MAIN [facility] SERVICES [address] [place], [place], [address] Phone: [phone]   Fax:  [phone]  Name: Stephanie Hawkins MRN: [id number] Date of Birth: Apr 12, 1971

## 2015-12-01 ENCOUNTER — Ambulatory Visit: Payer: BC Managed Care – PPO

## 2015-12-01 ENCOUNTER — Ambulatory Visit: Payer: BC Managed Care – PPO | Admitting: Occupational Therapy

## 2015-12-01 DIAGNOSIS — R279 Unspecified lack of coordination: Secondary | ICD-10-CM | POA: Diagnosis not present

## 2015-12-01 DIAGNOSIS — R278 Other lack of coordination: Secondary | ICD-10-CM

## 2015-12-01 DIAGNOSIS — M6281 Muscle weakness (generalized): Secondary | ICD-10-CM

## 2015-12-01 DIAGNOSIS — IMO0002 Reserved for concepts with insufficient information to code with codable children: Secondary | ICD-10-CM

## 2015-12-01 NOTE — Therapy (Signed)
[name] SERVICES [address] [place], [place], [address] Phone: [phone]   Fax:  [phone]  Occupational Therapy Treatment  Patient Details  Name: Stephanie Hawkins MRN: [id number] Date of Birth: 1971/01/01 Referring Provider: Manuella Ghazi  Encounter Date: 12/01/2015      OT End of Session - 12/01/15 1645    Visit Number 13   Number of Visits 16   Date for OT Re-Evaluation 12/07/15   OT Start Time 1545   OT Stop Time 1645   OT Time Calculation (min) 60 min   Activity Tolerance Patient tolerated treatment well   Behavior During Therapy [facility] for tasks assessed/performed      Past Medical History  Diagnosis Date  . Hypertension   . Stroke [facility])     Past Surgical History  Procedure Laterality Date  . Tubal ligation    . Tee without cardioversion N/A 09/29/2015    Procedure: Transesophageal Echocardiogram (Tee);  Surgeon: Dionisio David, MD;  Location: [facility] ORS;  Service: Cardiovascular;  Laterality: N/A;    There were no vitals filed for this visit.  Visit Diagnosis:  Other lack of coordination      Subjective Assessment - 12/01/15 1644    Subjective  Pt. Reports that her mother died when she was on her way to visit the pt. At the onset of her stroke.   Pertinent History Stephanie Hawkins is a 45 y.o. female with a known history of hypertension admitted to [facility] on 09/27/2015 with left lower extremity weakness. Patient woke up that morning was trying to go to the bathroom to brush her teeth. She felt weak in her left leg and also noticed some weakness in the left upper extremity. Patient says her left leg was weaker than the left upper extremity when she first noticed it. No history of any slurred speech. Patient had not been taking blood pressure medication for the last 1 year. Has not followed up with any primary care physician for the last few years. When patient presented to the emergency room her blood pressure was high and systolic  blood pressure was more than 210 mm of Hg and diastolic pressure was around 160 mm of Hg. Initial CT scan of the head did not show any intracranial abnormality. No history of any headache dizziness or blurry vision. No history of any fall or any seizures. Pt reports full independence with community mobility prior to arrival. 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 Brain MRI showed small acute lacunar CVA R posterior corona radiata to R putamen as well as chronic L CVA. Since discharge she has not been able to perform higher level IADL tasks such as laundry, cooking, cleaning.  She attempted to perform laundry and had to sit to fold clothes. Her church family has been bringing in meals.    Limitations Standing IADL tasks, UE strength and impaired coordination and hand function.   Patient Stated Goals Patient wants to be independent with all tasks and return to work as soon as possible.    Currently in Pain? No/denies      OT TREATMENT    Neuro muscular re-education: Pt. Worked on grasping 1" cubes with resistance with her left 2nd and 3rd digits. Pt. Worked on placing the cubes onto the base using her thumb to her 4th and 5th digits. Pt. performed [facility] tasks using the Grooved pegboard. Pt. worked on grasping the grooved pegs  from a horizontal position, and moving the pegs to a vertical position in the hand to prepare for placing them in the grooved slot. Pt. Worked on removing the pegs alternating between her thumb to 2nd through 5th digits. Pt. worked on mini resistive clips with her 4th and 5th digits.  Therapeutic Exercise: Pt. worked on the scifit for UE strengthening. Pt. required cues for left hand placement with constant monitoring of UE for 8 min. Pt. worked on forward and reverse movements. At level 2.5. Pt. performed gross gripping with grip strengthener. Pt. worked on sustaining grip while grasping pegs and reaching at various heights.  Pt. Worked on maintaining grip while reaching back with the left hand.                                OT Long Term Goals - 11/29/15 1616    OT LONG TERM GOAL #1   Title Patient will improve LUE strength by 1 mm grade to demonstrate lifting of pot filled with water to be independent with cooking.   Baseline unable to lift pots at evaluation especially if filled.    Status Partially Met   OT LONG TERM GOAL #2   Status Not Met   OT LONG TERM GOAL #3   Status Not Met   OT LONG TERM GOAL #4   Status Not Met   OT LONG TERM GOAL #5   Status Not Met   OT LONG TERM GOAL #6   Title Patient will increase grip strength on L hand by 3 pounds to be able to open jars and containers.    Baseline right grip 66#, left 39# at eval   Time 8   Period Weeks   Status Revised   OT LONG TERM GOAL #7   Title Pt. will improve Left UE control to be able to independently pick up items off the floor quickly.   Baseline Pt. has difficulty.   Time 8   Period Weeks   Status New   OT LONG TERM GOAL #8   Title Pt. will independently perfrom hair care while sustaining left UE in elevation the full duration without rest breaks.   Baseline Pt. left UE fatigues   Time 8   Period Weeks   Status New               Plan - 12/01/15 1647    Clinical Impression Statement Pt. iis making progress with UE strength and coordination. Pt. continues to have weakness with sustaining left hand grip on objects while reaching in vaious angles, and to the back left. Pt. continues to have diificulty working with left thumb oppostion to 4th and 5th digits when grasping objects.     Pt will benefit from skilled therapeutic intervention in order to improve on the following deficits (Retired) Decreased endurance;Decreased activity tolerance;Decreased knowledge of use of DME;Decreased strength;Decreased balance;Decreased mobility;Pain;Decreased coordination;Impaired UE functional use   Rehab Potential  Excellent   OT Frequency 2x / week   OT Duration 8 weeks   OT Treatment/Interventions Self-care/ADL training;Therapeutic exercise;Functional Mobility Training;Patient/family education;Neuromuscular education;Manual Therapy;Balance training;Therapeutic exercises;DME and/or AE instruction;Therapeutic activities   Consulted and Agree with Plan of Care Patient        Problem List Patient Active Problem List   Diagnosis Date Noted  . 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 Hypertensive urgency 09/27/2015  . TIA (transient ischemic attack)  09/27/2015   Harrel Carina, MS, OTR/L   Harrel Carina 12/01/2015, 5:06 PM  [place] MAIN [facility] SERVICES [address] [place], [place], [address] Phone: [phone]   Fax:  [phone]  Name: Stephanie Hawkins MRN: [id number] Date of Birth: 1970-09-24

## 2015-12-01 NOTE — Therapy (Signed)
[facility] [facility] MAIN [facility] SERVICES [address] [place], [place], [address] Phone: [phone]   Fax:  [phone]  Physical Therapy Treatment  Patient Details  Name: Stephanie Hawkins MRN: [id number] Date of Birth: October 17, 1970 Referring Provider: Dr Sherryll Burger  Encounter Date: 12/01/2015      PT End of Session - 12/01/15 1826    Visit Number 10   Number of Visits 17   Date for PT Re-Evaluation 12/15/15   PT Start Time 1645   PT Stop Time 1725   PT Time Calculation (min) 40 min   Equipment Utilized During Treatment Gait belt   Activity Tolerance Patient tolerated treatment well;Patient limited by fatigue   Behavior During Therapy [facility] for tasks assessed/performed      Past Medical History  Diagnosis Date  . Hypertension   . Stroke [facility])     Past Surgical History  Procedure Laterality Date  . Tubal ligation    . Tee without cardioversion N/A 09/29/2015    Procedure: Transesophageal Echocardiogram (Tee);  Surgeon: Laurier Nancy, MD;  Location: [facility] ORS;  Service: Cardiovascular;  Laterality: N/A;    There were no vitals filed for this visit.  Visit Diagnosis:  Muscle weakness (generalized)  Lack of coordination due to stroke      Subjective Assessment - 12/01/15 1825    Subjective pt reports she is having a rough emotional day. she also reports trying to wear high heels, but couldnt which makes her frustrated.     Limitations Standing;Walking   How long can you stand comfortably? 10 min stand then needs rest   How long can you walk comfortably? 10 min   Patient Stated Goals walk without AD   Currently in Pain? No/denies        ThereX: TM walking backwards 5% incline 30s  Erect 30s squat x 5 min total 0.7mph Side stepping resisted walking 12.5lbs x 3 laps each with squat Fwd resisted walking with march 12.5lbs x 5 laps SLS 30s x 5 each leg, cue for knee control Ball squat on wall 3 s hold x 12 SL heel raise x 10 Fwd/ retro lunge  resisted in CC x 10 12.5lbs Pt requires min verbal and tactile cues for proper exercise performance                           PT Education - 12/01/15 1826    Education provided Yes   Education Details single leg heel raise / toe walking to progress towards goal of walking in heels   Person(s) Educated Patient   Methods Explanation   Comprehension Verbalized understanding             PT Long Term Goals - 11/17/15 1706    PT LONG TERM GOAL #1   Title pt will improve 5x STS without UE use to <10 sec to demonstrate increased strength and decreased balance dysfunction   Baseline 18.97s /14.21   Time 4   Period Weeks   Status New   PT LONG TERM GOAL #2   Title pt will increase walk speed to 1.2 m/s without use of an AD to be a safe community ambulator   Baseline 0.85 m/s   Time 4   Period Weeks   Status Achieved   PT LONG TERM GOAL #3   Title pt will score >51/56 on the Berg Balance test to demonstrate low fall risk   Time 4   Period  Weeks   Status Achieved   PT LONG TERM GOAL #4   Title pt will be able to acend and decend 8 stairs in step over step pattern without AD use or railing, independently, in order to have access to second story of home   Time 4   Period Weeks   Status Achieved   PT LONG TERM GOAL #5   Title pt will be able to carry a bag of groceries in both hands and ascend and descend 8 steps in step over step pattern safely    Time 4   Period Weeks   Status New   Additional Long Term Goals   Additional Long Term Goals Yes   PT LONG TERM GOAL #6   Title pt will improve 6 min walk test by 15550ft in order to [employer] community mobility.    Baseline 114190ft   Time 4   Period Weeks   Status New               Plan - 12/01/15 1826    Clinical Impression Statement pt did well with resistance and balance training today. pt stated she would like to wear high heels again. PT modified treatment to include therex and education to be able to  reach her goals    Pt will benefit from skilled therapeutic intervention in order to improve on the following deficits Abnormal gait;Decreased balance;Decreased endurance;Decreased strength;Difficulty walking;Impaired sensation   Rehab Potential Excellent   PT Frequency 2x / week   PT Duration 4 weeks   PT Treatment/Interventions ADLs/Self Care Home Management;Electrical Stimulation;Iontophoresis 4mg /ml Dexamethasone;Ultrasound;Gait training;Stair training;Functional mobility training;Therapeutic exercise;Balance training;Neuromuscular re-education;Patient/family education;Manual techniques;Energy conservation   PT Next Visit Plan progression of therapeutic exercise   PT Home Exercise Plan sit to stand, calf raises, lateral band walks, marches   Consulted and Agree with Plan of Care Patient        Problem List Patient Active Problem List   Diagnosis Date Noted  . 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 Hypertensive urgency 09/27/2015  . TIA (transient ischemic attack) 09/27/2015   Carlyon ShadowAshley C. Purva Vessell, PT, DPT [phone]  Marisela Line 12/01/2015, 6:28 PM  [facility] MAIN [facility] [address] [place], [place], [address] Phone: [phone]   Fax:  [phone]  Name: Stephanie Hawkins MRN: [id number] Date of Birth: 10/02/1970

## 2015-12-01 NOTE — Patient Instructions (Signed)
OT TREATMENT    Neuro muscular re-education: Pt. Worked on grasping 1" cubes with resistance with her left 2nd and 3rd digits. Pt. Worked on placing the cubes onto the base using her thumb to her 4th and 5th digits. Pt. performed [facility] tasks using the Grooved pegboard. Pt. worked on grasping the grooved pegs from a horizontal position, and moving the pegs to a vertical position in the hand to prepare for placing them in the grooved slot. Pt. Worked on removing the pegs alternating between her thumb to 2nd through 5th digits. Pt. worked on mini resistive clips with her 4th and 5th digits.  Therapeutic Exercise: Pt. worked on the scifit for UE strengthening. Pt. required cues for left hand placement with constant monitoring of UE for 8 min. Pt. worked on forward and reverse movements. At level 2.5. Pt. performed gross gripping with grip strengthener. Pt. worked on sustaining grip while grasping pegs and reaching at various heights. Pt. Worked on maintaining grip while reaching back with the left hand.

## 2015-12-06 ENCOUNTER — Ambulatory Visit: Payer: BC Managed Care – PPO | Attending: Neurology | Admitting: Occupational Therapy

## 2015-12-06 ENCOUNTER — Ambulatory Visit: Payer: BC Managed Care – PPO

## 2015-12-06 ENCOUNTER — Encounter: Payer: Self-pay | Admitting: Occupational Therapy

## 2015-12-06 DIAGNOSIS — R2689 Other abnormalities of gait and mobility: Secondary | ICD-10-CM

## 2015-12-06 DIAGNOSIS — M6281 Muscle weakness (generalized): Secondary | ICD-10-CM | POA: Diagnosis present

## 2015-12-06 DIAGNOSIS — R2681 Unsteadiness on feet: Secondary | ICD-10-CM | POA: Diagnosis present

## 2015-12-06 DIAGNOSIS — R278 Other lack of coordination: Secondary | ICD-10-CM

## 2015-12-06 NOTE — Therapy (Signed)
[place] [facility] [address] [place], [place], [address] Phone: [phone]   Fax:  [phone]  Physical Therapy Treatment  Patient Details  Name: Stephanie Hawkins MRN: [id number] Date of Birth: 12-Aug-1971 Referring Provider: Dr Sherryll BurgerShah  Encounter Date: 12/06/2015      PT End of Session - 12/06/15 1651    Visit Number 11   Number of Visits 17   Date for PT Re-Evaluation 12/15/15   PT Start Time 1645   PT Stop Time 1730   PT Time Calculation (min) 45 min   Equipment Utilized During Treatment Gait belt   Activity Tolerance Patient tolerated treatment well;Patient limited by fatigue   Behavior During Therapy [facility] for tasks assessed/performed      Past Medical History  Diagnosis Date  . Hypertension   . Stroke [facility])     Past Surgical History  Procedure Laterality Date  . Tubal ligation    . Tee without cardioversion N/A 09/29/2015    Procedure: Transesophageal Echocardiogram (Tee);  Surgeon: Laurier NancyShaukat A Khan, MD;  Location: [facility] ORS;  Service: Cardiovascular;  Laterality: N/A;    There were no vitals filed for this visit.  Visit Diagnosis:  Muscle weakness (generalized)  Imbalance      Subjective Assessment - 12/06/15 1651    Subjective pt reports she has been doing some online exercise program   Limitations Standing;Walking   How long can you stand comfortably? 10 min stand then needs rest   How long can you walk comfortably? 10 min   Patient Stated Goals walk without AD   Currently in Pain? No/denies       weighted squat 15lbs 2x10  Up down steps step over step with 5lbs hand weights. X 1 lap Alt BOSU lunge 2x10 each leg fwd  side step over BOSU 2x10  ladder drills various patterns cues to inc. Speed x 13 min total Pt requires min verbal and tactile cues for proper exercise performance                             PT Long Term Goals - 11/17/15 1706    PT LONG TERM GOAL #1   Title pt  will improve 5x STS without UE use to <10 sec to demonstrate increased strength and decreased balance dysfunction   Baseline 18.97s /14.21   Time 4   Period Weeks   Status New   PT LONG TERM GOAL #2   Title pt will increase walk speed to 1.2 m/s without use of an AD to be a safe community ambulator   Baseline 0.85 m/s   Time 4   Period Weeks   Status Achieved   PT LONG TERM GOAL #3   Title pt will score >51/56 on the Berg Balance test to demonstrate low fall risk   Time 4   Period Weeks   Status Achieved   PT LONG TERM GOAL #4   Title pt will be able to acend and decend 8 stairs in step over step pattern without AD use or railing, independently, in order to have access to second story of home   Time 4   Period Weeks   Status Achieved   PT LONG TERM GOAL #5   Title pt will be able to carry a bag of groceries in both hands and ascend and descend 8 steps in step over step pattern safely    Time 4  Period Weeks   Status New   Additional Long Term Goals   Additional Long Term Goals Yes   PT LONG TERM GOAL #6   Title pt will improve 6 min walk test by 149ft in order to [employer] mobility.    Baseline 1149ft   Time 4   Period Weeks   Status New               Plan - 12/06/15 1703    Clinical Impression Statement pt progressing well through strengthening exercises. she has started working out to [employer]. pt was able to go up/ down steps consecutively holding light weight today safely   Pt will benefit from skilled therapeutic intervention in order to improve on the following deficits Abnormal gait;Decreased balance;Decreased endurance;Decreased strength;Difficulty walking;Impaired sensation   Rehab Potential Excellent   PT Frequency 2x / week   PT Duration 4 weeks   PT Treatment/Interventions ADLs/Self Care Home Management;Electrical Stimulation;Iontophoresis /ml Dexamethasone;Ultrasound;Gait training;Stair training;Functional mobility  training;Therapeutic exercise;Balance training;Neuromuscular re-education;Patient/family education;Manual techniques;Energy conservation   PT Next Visit Plan progression of therapeutic exercise   PT Home Exercise Plan sit to stand, calf raises, lateral band walks, marches   Consulted and Agree with Plan of Care Patient        Problem List Patient Active Problem List   Diagnosis Date Noted  . 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 Hypertensive urgency 09/27/2015  . TIA (transient ischemic attack) 09/27/2015   Carlyon Shadow. Blanca Carreon, PT, DPT [id number]  Stephanie Hawkins 12/06/2015, 5:04 PM  [place] [facility] MAIN [facility] SERVICES [address] [place], [place], [address] Phone: [phone]   Fax:  [phone]  Name: Stephanie Hawkins MRN: [id number] Date of Birth: 12-11-1970

## 2015-12-06 NOTE — Therapy (Signed)
[place] MAIN [facility] SERVICES [address] [place], [place], [address] Phone: [phone]   Fax:  [phone]  Occupational Therapy Treatment  Patient Details  Name: Stephanie Hawkins MRN: [id number] Date of Birth: 06/02/1971 Referring Provider: Manuella Ghazi  Encounter Date: 12/06/2015      OT End of Session - 12/06/15 1648    Visit Number 14   Number of Visits 16   Date for OT Re-Evaluation 12/07/15   OT Start Time 1603   OT Stop Time 1641   OT Time Calculation (min) 38 min   Activity Tolerance Patient tolerated treatment well   Behavior During Therapy [facility] for tasks assessed/performed      Past Medical History  Diagnosis Date  . Hypertension   . Stroke [facility])     Past Surgical History  Procedure Laterality Date  . Tubal ligation    . Tee without cardioversion N/A 09/29/2015    Procedure: Transesophageal Echocardiogram (Tee);  Surgeon: Dionisio David, MD;  Location: [facility] ORS;  Service: Cardiovascular;  Laterality: N/A;    There were no vitals filed for this visit.  Visit Diagnosis:  Other lack of coordination      Subjective Assessment - 12/06/15 1645    Subjective  Pt. reports not feeling as tired this week.   Patient is accompained by: Family member   Pertinent History Stephanie Hawkins is a 45 y.o. female with a known history of hypertension admitted to [facility] on 09/27/2015 with left lower extremity weakness. Patient woke up that morning was trying to go to the bathroom to brush her teeth. She felt weak in her left leg and also noticed some weakness in the left upper extremity. Patient says her left leg was weaker than the left upper extremity when she first noticed it. No history of any slurred speech. Patient had not been taking blood pressure medication for the last 1 year. Has not followed up with any primary care physician for the last few years. When patient presented to the emergency room her blood pressure was high and systolic blood pressure  was more than 210 mm of Hg and diastolic pressure was around 160 mm of Hg. Initial CT scan of the head did not show any intracranial abnormality. No history of any headache dizziness or blurry vision. No history of any fall or any seizures. Pt reports full independence with community mobility prior to arrival. 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 Brain MRI showed small acute lacunar CVA R posterior corona radiata to R putamen as well as chronic L CVA. Since discharge she has not been able to perform higher level IADL tasks such as laundry, cooking, cleaning.  She attempted to perform laundry and had to sit to fold clothes. Her church family has been bringing in meals.    Limitations Standing IADL tasks, UE strength and impaired coordination and hand function.   Patient Stated Goals Patient wants to be independent with all tasks and return to work as soon as possible.    Currently in Pain? No/denies   Pain Score 0-No pain   Pain Location Arm   Pain Orientation Left   Pain Descriptors / Indicators Aching   Multiple Pain Sites No        OT TREATMENT     Neuro muscular re-education: Pt. Worked on grasping 1", and 1/2 "objects from the floor with her left hand. Reaching posteriorly was emphasized. Pt. Was able  to grasp all objects successfully. Pt. Worked on the development of [facility] focusing on improving  Moving an object with her hand alternating her 4th and 5th digits. Pt. Worked on speed with grasping objects and moving them within her hand.   Therapeutic Exercise: Pt. performed gross gripping with grip strengthener. Pt. worked on sustaining grip while grasping pegs and reaching at various heights. Pt. With improved balance when standing, and sustaining grip.                        OT Education - 12/06/15 1647    Education provided Yes   Education Details [facility]    Person(s) Educated Patient   Methods Explanation    Comprehension Verbalized understanding             OT Long Term Goals - 11/29/15 1616    OT LONG TERM GOAL #1   Title Patient will improve LUE strength by 1 mm grade to demonstrate lifting of pot filled with water to be independent with cooking.   Baseline unable to lift pots at evaluation especially if filled.    Status Partially Met   OT LONG TERM GOAL #2   Status Not Met   OT LONG TERM GOAL #3   Status Not Met   OT LONG TERM GOAL #4   Status Not Met   OT LONG TERM GOAL #5   Status Not Met   OT LONG TERM GOAL #6   Title Patient will increase grip strength on L hand by 3 pounds to be able to open jars and containers.    Baseline right grip 66#, left 39# at eval   Time 8   Period Weeks   Status Revised   OT LONG TERM GOAL #7   Title Pt. will improve Left UE control to be able to independently pick up items off the floor quickly.   Baseline Pt. has difficulty.   Time 8   Period Weeks   Status New   OT LONG TERM GOAL #8   Title Pt. will independently perfrom hair care while sustaining left UE in elevation the full duration without rest breaks.   Baseline Pt. left UE fatigues   Time 8   Period Weeks   Status New               Plan - 12/06/15 1649    Clinical Impression Statement Pt. is improving with LEft UE strength and coordination. Pt. continues to have difficulty reaching for objects on the floor to the left. Pt. continues to have difficulty with alternating movements in the 4th and 5th digits.   Pt will benefit from skilled therapeutic intervention in order to improve on the following deficits (Retired) Decreased endurance;Decreased activity tolerance;Decreased knowledge of use of DME;Decreased strength;Decreased balance;Decreased mobility;Pain;Decreased coordination;Impaired UE functional use   Rehab Potential Excellent   OT Frequency 2x / week   OT Duration 8 weeks   OT Treatment/Interventions Self-care/ADL training;Therapeutic exercise;Functional Mobility  Training;Patient/family education;Neuromuscular education;Manual Therapy;Balance training;Therapeutic exercises;DME and/or AE instruction;Therapeutic activities   Consulted and Agree with Plan of Care Patient        Problem List Patient Active Problem List   Diagnosis Date Noted  . 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 Hypertensive urgency 09/27/2015  . TIA (transient ischemic attack) 09/27/2015   Harrel Carina, MS, OTR/L  Harrel Carina 12/06/2015, 5:02 PM  [place] MAIN [facility] SERVICES [phone]  [place], [place], [address] Phone: [phone]   Fax:  [phone]  Name: Aidan Caloca MRN: [id number] Date of Birth: 01/12/1971

## 2015-12-06 NOTE — Patient Instructions (Signed)
OT TREATMENT     Neuro muscular re-education: Pt. Worked on grasping 1", and 1/2 "objects from the floor with her left hand. Reaching posteriorly was emphasized. Pt. Was able to grasp all objects successfully. Pt. Worked on the development of [facility] focusing on improving  Moving an object with her hand alternating her 4th and 5th digits. Pt. Worked on speed with grasping objects and moving them within her hand.   Therapeutic Exercise: Pt. performed gross gripping with grip strengthener. Pt. worked on sustaining grip while grasping pegs and reaching at various heights. Pt. With improved balance when standing, and sustaining grip.  Selfcare:  Manual Therapy:

## 2015-12-08 ENCOUNTER — Ambulatory Visit: Payer: BC Managed Care – PPO | Admitting: Occupational Therapy

## 2015-12-08 ENCOUNTER — Ambulatory Visit: Payer: BC Managed Care – PPO

## 2015-12-08 DIAGNOSIS — R278 Other lack of coordination: Secondary | ICD-10-CM | POA: Diagnosis not present

## 2015-12-08 NOTE — Therapy (Signed)
[place] MAIN [facility] SERVICES [address] [place], [place], [address] Phone: [phone]   Fax:  [phone]  Occupational Therapy Treatment  Patient Details  Name: Stephanie Hawkins MRN: [id number] Date of Birth: 18-Nov-1970 Referring Provider: Manuella Ghazi  Encounter Date: 12/08/2015      OT End of Session - 12/08/15 1624    Visit Number 15   Number of Visits 16   Date for OT Re-Evaluation 01/06/16   OT Start Time 1610   OT Stop Time 1650   OT Time Calculation (min) 40 min   Activity Tolerance Patient tolerated treatment well   Behavior During Therapy [facility] for tasks assessed/performed      Past Medical History  Diagnosis Date  . Hypertension   . Stroke [facility])     Past Surgical History  Procedure Laterality Date  . Tubal ligation    . Tee without cardioversion N/A 09/29/2015    Procedure: Transesophageal Echocardiogram (Tee);  Surgeon: Dionisio David, MD;  Location: [facility] ORS;  Service: Cardiovascular;  Laterality: N/A;    There were no vitals filed for this visit.  Visit Diagnosis:  Other lack of coordination      Subjective Assessment - 12/08/15 1621    Subjective  Pt. reports that she had to cancel her trip to [place] this weekend because of the weather.    Pertinent History Stephanie Hawkins is a 45 y.o. female with a known history of hypertension admitted to [facility] on 09/27/2015 with left lower extremity weakness. Patient woke up that morning was trying to go to the bathroom to brush her teeth. She felt weak in her left leg and also noticed some weakness in the left upper extremity. Patient says her left leg was weaker than the left upper extremity when she first noticed it. No history of any slurred speech. Patient had not been taking blood pressure medication for the last 1 year. Has not followed up with any primary care physician for the last few years. When patient presented to the emergency room her blood pressure was high and systolic blood pressure  was more than 210 mm of Hg and diastolic pressure was around 160 mm of Hg. Initial CT scan of the head did not show any intracranial abnormality. No history of any headache dizziness or blurry vision. No history of any fall or any seizures. Pt reports full independence with community mobility prior to arrival. 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 Brain MRI showed small acute lacunar CVA R posterior corona radiata to R putamen as well as chronic L CVA. Since discharge she has not been able to perform higher level IADL tasks such as laundry, cooking, cleaning.  She attempted to perform laundry and had to sit to fold clothes. Her church family has been bringing in meals.    Limitations Standing IADL tasks, UE strength and impaired coordination and hand function.   Patient Stated Goals Patient wants to be independent with all tasks and return to work as soon as possible.    Currently in Pain? No/denies   Pain Score 0-No pain      OT TREATMENT     Neuro muscular re-education: Pt. performed [facility] skills training to improve speed and dexterity needed for ADL tasks and writing. Pt. demonstrated grasping 1 inch sticks,  inch cylindrical collars, and  inch flat washers on the Purdue pegboard. Pt. performed grasping each item with her 2nd digit and  thumb, and storing them in the palm. Pt. presented with difficulty storing  inch objects at a time in the palmar aspect of the hand. Pt.worked on alternating right and left hands to place the sticks away. Pt. Worked on grasping 1/8" objects alternating 2nd through 5th digits.   Therapeutic Exercise: Pt. performed gross gripping with grip strengthener. Pt. worked on sustaining grip while grasping pegs and reaching at various heights. Pt. Worked on reaching posteriorly while maintaining the grip to simulate hand function and gripping while reaching into the back seat of the car. Pt. Worked on the digiflex with  emphasis placed on the 4th and 5th digits.                        OT Education - 12/08/15 1623    Education Details [place] and functional hand movements.   Person(s) Educated Patient   Methods Explanation   Comprehension Verbalized understanding             OT Long Term Goals - 11/29/15 1616    OT LONG TERM GOAL #1   Title Patient will improve LUE strength by 1 mm grade to demonstrate lifting of pot filled with water to be independent with cooking.   Baseline unable to lift pots at evaluation especially if filled.    Status Partially Met   OT LONG TERM GOAL #2   Status Not Met   OT LONG TERM GOAL #3   Status Not Met   OT LONG TERM GOAL #4   Status Not Met   OT LONG TERM GOAL #5   Status Not Met   OT LONG TERM GOAL #6   Title Patient will increase grip strength on L hand by 3 pounds to be able to open jars and containers.    Baseline right grip 66#, left 39# at eval   Time 8   Period Weeks   Status Revised   OT LONG TERM GOAL #7   Title Pt. will improve Left UE control to be able to independently pick up items off the floor quickly.   Baseline Pt. has difficulty.   Time 8   Period Weeks   Status New   OT LONG TERM GOAL #8   Title Pt. will independently perfrom hair care while sustaining left UE in elevation the full duration without rest breaks.   Baseline Pt. left UE fatigues   Time 8   Period Weeks   Status New               Plan - 12/08/15 1638    Clinical Impression Statement Pt. is improving with left hand [facility] skills. Pt. has improved with grasping small 1/8, 1/16 "objects. Pt. continues to present with difficulty maintaining grip while reaching back. Pt. conitnues to have difficulty alternating her 4th and 5th digits working together. during tasks.   Pt will benefit from skilled therapeutic intervention in order to improve on the following deficits (Retired) Decreased endurance;Decreased activity tolerance;Decreased knowledge of use of  DME;Decreased strength;Decreased balance;Decreased mobility;Pain;Decreased coordination;Impaired UE functional use   Rehab Potential Excellent   OT Frequency 2x / week   OT Duration 8 weeks   OT Treatment/Interventions Self-care/ADL training;Therapeutic exercise;Functional Mobility Training;Patient/family education;Neuromuscular education;Manual Therapy;Balance training;Therapeutic exercises;DME and/or AE instruction;Therapeutic activities   Plan Continue to improve left hand coordination. Recert next visit.   Consulted and Agree with Plan of Care Patient        Problem List Patient Active Problem List  Diagnosis Date Noted  . 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 Hypertensive urgency 09/27/2015  . TIA (transient ischemic attack) 09/27/2015   Harrel Carina, MS, OTR/L   Harrel Carina 12/08/2015, 5:13 PM  [place] MAIN [facility] SERVICES [address] [place], [place], [address] Phone: [phone]   Fax:  [phone]  Name: Stephanie Hawkins MRN: [id number] Date of Birth: 1971-01-04

## 2015-12-08 NOTE — Patient Instructions (Signed)
OT TREATMENT     Neuro muscular re-education: Pt. performed [facility] skills training to improve speed and dexterity needed for ADL tasks and writing. Pt. demonstrated grasping 1 inch sticks,  inch cylindrical collars, and  inch flat washers on the Purdue pegboard. Pt. performed grasping each item with her 2nd digit and thumb, and storing them in the palm. Pt. presented with difficulty storing  inch objects at a time in the palmar aspect of the hand. Pt.worked on alternating right and left hands to place the sticks away. Pt. Worked on grasping 1/8" objects alternating 2nd through 5th digits.   Therapeutic Exercise: Pt. performed gross gripping with grip strengthener. Pt. worked on sustaining grip while grasping pegs and reaching at various heights. Pt. Worked on reaching posteriorly while maintaining the grip to simulate hand function and gripping while reaching into the back seat of the car. Pt. Worked on the digiflex with emphasis placed on the 4th and 5th digits.

## 2015-12-13 ENCOUNTER — Ambulatory Visit: Payer: BC Managed Care – PPO

## 2015-12-13 ENCOUNTER — Ambulatory Visit: Payer: BC Managed Care – PPO | Admitting: Occupational Therapy

## 2015-12-13 DIAGNOSIS — M6281 Muscle weakness (generalized): Secondary | ICD-10-CM

## 2015-12-13 DIAGNOSIS — R278 Other lack of coordination: Secondary | ICD-10-CM

## 2015-12-13 DIAGNOSIS — R2681 Unsteadiness on feet: Secondary | ICD-10-CM

## 2015-12-13 NOTE — Therapy (Signed)
[place] [facility] MAIN [facility] SERVICES [address] [place], [place], [address] Phone: [phone]   Fax:  [phone]  Physical Therapy Treatment  Patient Details  Name: Stephanie BoughLakecia Hawkins MRN: [id number] Date of Birth: [birth year] Referring Provider: Dr Sherryll BurgerShah  Encounter Date: 12/13/2015      PT End of Session - 12/13/15 1656    Visit Number 12   Number of Visits 25   Date for PT Re-Evaluation 01/10/16   PT Start Time 1650   PT Stop Time 1730   PT Time Calculation (min) 40 min   Equipment Utilized During Treatment Gait belt   Activity Tolerance Patient tolerated treatment well;Patient limited by fatigue   Behavior [facility] Therapy [facility] for tasks assessed/performed      Past Medical History  Diagnosis Date  . Hypertension   . Stroke [facility])     Past Surgical History  Procedure Laterality Date  . Tubal ligation    . Tee without cardioversion N/A 09/29/2015    Procedure: Transesophageal Echocardiogram (Tee);  Surgeon: Laurier NancyShaukat A Khan, MD;  Location: [facility] ORS;  Service: Cardiovascular;  Laterality: N/A;    There were no vitals filed for this visit.      Subjective Assessment - 12/13/15 1655    Subjective pt reports she is doing well on this date. no questions or concerns.    Limitations Standing;Walking   How long can you stand comfortably? 10 min stand then needs rest   How long can you walk comfortably? 10 min   Patient Stated Goals walk without AD   Currently in Pain? No/denies        therex    Rec bike L 3 x 4 min no charge Sit to stand with 15lb R LE elevated on dyna disc 2x10 Monster walk green band x 3 laps 7220ft Side stepping green band x 3 laps 8020ft 1/2 roll semi tandem flat top up 2x5 mini squat  1/2 roll AP balance EO/EC x 4 min mult bouts 1/2 roll AP balance with mini squat x 10 Clamshells 2x10 LLE Tball bridge bent knee x 15 Pt requires min verbal and tactile cues for proper exercise performance                              PT Long Term Goals - 12/13/15 1656    PT LONG TERM GOAL #1   Title pt will improve 5x STS without UE use to <10 sec to demonstrate increased strength and decreased balance dysfunction   Baseline 18.97s /14.21   Time 4   Period Weeks   Status New   PT LONG TERM GOAL #2   Title pt will increase walk speed to 1.2 m/s without use of an AD to be a safe community ambulator   Baseline 0.85 m/s   Time 4   Period Weeks   Status Achieved   PT LONG TERM GOAL #3   Title pt will score >51/56 on the Berg Balance test to demonstrate low fall risk   Time 4   Period Weeks   Status Achieved   PT LONG TERM GOAL #4   Title pt will be able to acend and decend 8 stairs in step over step pattern without AD use or railing, independently, in order to have access to second story of home   Time 4   Period Weeks   Status Achieved   PT LONG TERM GOAL #5   Title  pt will be able to carry a bag of groceries in both hands and ascend and descend 8 steps in step over step pattern safely    Time 4   Period Weeks   Status New   PT LONG TERM GOAL #6   Title pt will improve 6 min walk test by 146ft in order to [employer] mobility.    Baseline 1158ft   Time 4   Period Weeks   Status New               Plan - 12/13/15 1723    Clinical Impression Statement pt continues to progress well through activities. she still has residual weakness in L hip, glute and quad. plan to assess for DC next visit to progressed HEP   Rehab Potential Excellent   PT Frequency 2x / week   PT Duration 4 weeks   PT Treatment/Interventions ADLs/Self Care Home Management;Electrical Stimulation;Iontophoresis /ml Dexamethasone;Ultrasound;Gait training;Stair training;Functional mobility training;Therapeutic exercise;Balance training;Neuromuscular re-education;Patient/family education;Manual techniques;Energy conservation   PT Next Visit Plan progression of therapeutic exercise    PT Home Exercise Plan sit to stand, calf raises, lateral band walks, marches   Consulted and Agree with Plan of Care Patient      Patient will benefit from skilled therapeutic intervention in order to improve the following deficits and impairments:  Abnormal gait, Decreased balance, Decreased endurance, Decreased strength, Difficulty walking, Impaired sensation  Visit Diagnosis: Muscle weakness (generalized) - Plan: PT plan of care cert/re-cert  Unsteadiness on feet - Plan: PT plan of care cert/re-cert     Problem List Patient Active Problem List   Diagnosis Date Noted  . 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 Hypertensive urgency 09/27/2015  . TIA (transient ischemic attack) 09/27/2015   Carlyon Shadow. Leigh Kaeding, PT, DPT [phone]  Sanii Kukla 12/13/2015, 6:32 PM  [place] [facility] MAIN [facility] SERVICES [address] [place], [place], [address] Phone: [phone]   Fax:  [phone]  Name: Stephanie Hawkins MRN: [id number] Date of Birth: 1971-06-14

## 2015-12-13 NOTE — Patient Instructions (Addendum)
OT TREATMENT    Neuro muscular re-education: pt. Worked on grasping mini resistive clips with her left 4th and 5th digits. Pt. Worked on reaching up to place the clips while sustaining grasp on the clips. Cues were required, and rest breaks as pt. fatigues.  Selfcare: Pt. Worked on typing speed in preparation for work related tasks. Pt.was able to type 1  Six word sentence in 1 min &24 sec., Pt. Typed a 6 sentence paragraph in 5 min & 35 sec. Pt. Made multiple typing errors, however was able to initiate self-correcting.

## 2015-12-13 NOTE — Therapy (Signed)
[place] MAIN [facility] SERVICES [address] [place], [place], [address] Phone: [phone]   Fax:  [phone]  Occupational Therapy Treatment/Progress Report/Recert  Patient Details  Name: Stephanie Hawkins MRN: [id number] Date of Birth: [birth year] Referring Provider: Manuella Ghazi  Encounter Date: 12/13/2015      OT End of Session - 12/13/15 1650    Visit Number [id number]   Number of Visits 16   Date for OT Re-Evaluation 02/07/16   OT Start Time 8099   OT Stop Time 1643   OT Time Calculation (min) 41 min      Past Medical History  Diagnosis Date  . Hypertension   . Stroke [facility])     Past Surgical History  Procedure Laterality Date  . Tubal ligation    . Tee without cardioversion N/A 09/29/2015    Procedure: Transesophageal Echocardiogram (Tee);  Surgeon: Dionisio David, MD;  Location: [facility] ORS;  Service: Cardiovascular;  Laterality: N/A;    There were no vitals filed for this visit.      Subjective Assessment - 12/13/15 1659    Subjective  Pt. reports that she is planning to return to work next week part time.    Patient is accompained by: Family member   Pertinent History Stephanie Hawkins is a 45 y.o. female with a known history of hypertension admitted to [facility] on 09/27/2015 with left lower extremity weakness. Patient woke up that morning was trying to go to the bathroom to brush her teeth. She felt weak in her left leg and also noticed some weakness in the left upper extremity. Patient says her left leg was weaker than the left upper extremity when she first noticed it. No history of any slurred speech. Patient had not been taking blood pressure medication for the last 1 year. Has not followed up with any primary care physician for the last few years. When patient presented to the emergency room her blood pressure was high and systolic blood pressure was more than 210 mm of Hg and diastolic pressure was around 160 mm of Hg. Initial CT scan of the head did  not show any intracranial abnormality. No history of any headache dizziness or blurry vision. No history of any fall or any seizures. Pt reports full independence with community mobility prior to arrival. 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 Brain MRI showed small acute lacunar CVA R posterior corona radiata to R putamen as well as chronic L CVA. Since discharge she has not been able to perform higher level IADL tasks such as laundry, cooking, cleaning.  She attempted to perform laundry and had to sit to fold clothes. Her church family has been bringing in meals.    Limitations Standing IADL tasks, UE strength and impaired coordination and hand function.   Patient Stated Goals Patient wants to be independent with all tasks and return to work as soon as possible.       OT TREATMENT    Neuro muscular re-education: pt. Worked on grasping mini resistive clips with her left 4th and 5th digits. Pt. Worked on reaching up to place the clips while sustaining grasp on the clips. Cues were required, and rest breaks as pt. fatigues.  Selfcare: Pt. Worked on typing speed in preparation for work related tasks. Pt.was able to type 1  Six word sentence in 1 min &24 sec., Pt. Typed a 6 sentence paragraph in 5 min & 35 sec.  Pt. Made multiple typing errors, however was able to initiate self-correcting.                                OT Long Term Goals - 12/13/15 1604    OT LONG TERM GOAL #1   Title Patient will improve LUE strength by 1 mm grade to demonstrate lifting of pot filled with water to be independent with cooking.   Baseline unable to lift pots at evaluation especially if filled.    Time 8   Period Weeks   Status Partially Met   Long Term Additional Goals   Additional Long Term Goals Yes   OT LONG TERM GOAL #6   Title Patient will increase grip strength on L hand by 3 pounds to be able to open jars and containers.     Baseline right grip 66#, left 39# at eval   Time 8   Period Weeks   Status Revised   OT LONG TERM GOAL #7   Title Pt. will improve Left UE control to be able to independently pick up items off the floor quickly.   Baseline Pt. has difficulty.   Time 8   Status New   OT LONG TERM GOAL #8   Title Pt. will independently perform hair care while sustaining left UE in elevation the full duration without rest breaks.   Time 8   Period Weeks   Status New   OT LONG TERM GOAL  #10   TITLE Pt. will improve typing speed with typing a 6 sentence paragraph by 1 min.   Baseline 5 min. & 35 sec.   1 sentence:1 min. & 24 sec.   Time 8   Period Weeks   Status New               Plan - 12/13/15 1653    Clinical Impression Statement Pt. continues to improve with  left hand strength, and coordination skills. Pt. requires continued work to improve her left 4th and 5th digit movement during typing in preparation fot work related typing tasks. pt. requires continued skilled OT services to work on improving her ability to braid her hair while her UEs are sustained in elevation, and left hand control while reaching back to pick items off the floor.    Rehab Potential Excellent   OT Frequency 2x / week   OT Duration 8 weeks   OT Treatment/Interventions Self-care/ADL training;Therapeutic exercise;Functional Mobility Training;Patient/family education;Neuromuscular education;Manual Therapy;Balance training;Therapeutic exercises;DME and/or AE instruction;Therapeutic activities      Patient will benefit from skilled therapeutic intervention in order to improve the following deficits and impairments:  Decreased endurance, Decreased activity tolerance, Decreased knowledge of use of DME, Decreased strength, Decreased balance, Decreased mobility, Pain, Decreased coordination, Impaired UE functional use  Visit Diagnosis: Other lack of coordination - Plan: Ot plan of care cert/re-cert    Problem  List Patient Active Problem List   Diagnosis Date Noted  . 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 Hypertensive urgency 09/27/2015  . TIA (transient ischemic attack) 09/27/2015   Harrel Carina, MS, OTR/L   Harrel Carina 12/13/2015, 5:11 PM  [place] MAIN [facility] SERVICES [address] [place], [place], [address] Phone: [phone]   Fax:  [phone]  Name: Tamiah Dysart MRN: [id number] Date of Birth: 06/07/1971

## 2015-12-15 ENCOUNTER — Ambulatory Visit: Payer: BC Managed Care – PPO | Admitting: Occupational Therapy

## 2015-12-15 ENCOUNTER — Ambulatory Visit: Payer: BC Managed Care – PPO

## 2015-12-20 ENCOUNTER — Ambulatory Visit: Payer: BC Managed Care – PPO | Admitting: Occupational Therapy

## 2015-12-20 ENCOUNTER — Ambulatory Visit: Payer: BC Managed Care – PPO

## 2015-12-20 DIAGNOSIS — M6281 Muscle weakness (generalized): Secondary | ICD-10-CM

## 2015-12-20 DIAGNOSIS — R278 Other lack of coordination: Secondary | ICD-10-CM | POA: Diagnosis not present

## 2015-12-20 NOTE — Therapy (Signed)
[facility] MAIN [facility] [address] [place], [place], [address] Phone: [phone]   Fax:  [phone]  Physical Therapy Treatment / Discharge note  Patient Details  Name: Stephanie Hawkins MRN: [id number] Date of Birth: 1971-08-11 Referring Provider: Dr Sherryll BurgerShah  Encounter Date: 12/20/2015      PT End of Session - 12/20/15 1722    Visit Number 13   Number of Visits 25   Date for PT Re-Evaluation 01/10/16   PT Start Time 1655   PT Stop Time 1720   PT Time Calculation (min) 25 min   Equipment Utilized During Treatment Gait belt   Activity Tolerance Patient tolerated treatment well;Patient limited by fatigue   Behavior During Therapy [facility] for tasks assessed/performed      Past Medical History  Diagnosis Date  . Hypertension   . Stroke [facility])     Past Surgical History  Procedure Laterality Date  . Tubal ligation    . Tee without cardioversion N/A 09/29/2015    Procedure: Transesophageal Echocardiogram (Tee);  Surgeon: Laurier NancyShaukat A Khan, MD;  Location: [facility] ORS;  Service: Cardiovascular;  Laterality: N/A;    There were no vitals filed for this visit.      Subjective Assessment - 12/20/15 1722    Subjective pt reports she feels pretty good about her progress and her adv HEP   Limitations Standing;Walking   How long can you stand comfortably? 10 min stand then needs rest   How long can you walk comfortably? 10 min   Patient Stated Goals walk without AD   Currently in Pain? No/denies        PT reassessed goals and outcome measures: 5x sit to stand 9.8s  6min walk 132375ft Pt can ascend/descend steps with weights in hand safely Discussed progression of HEP and issued green T band                           PT Education - 12/20/15 1722    Education provided Yes   Education Details general exercise program. HEP   Person(s) Educated Patient   Methods Explanation   Comprehension Verbalized understanding              PT Long Term Goals - 12/20/15 1723    PT LONG TERM GOAL #1   Title pt will improve 5x STS without UE use to <10 sec to demonstrate increased strength and decreased balance dysfunction   Baseline 18.97s /14.21/ 9.8s   Time 4   Period Weeks   Status Achieved   PT LONG TERM GOAL #2   Title pt will increase walk speed to 1.2 m/s without use of an AD to be a safe community ambulator   Baseline 0.85 m/s   Time 4   Period Weeks   Status Achieved   PT LONG TERM GOAL #3   Title pt will score >51/56 on the Berg Balance test to demonstrate low fall risk   Time 4   Period Weeks   Status Achieved   PT LONG TERM GOAL #4   Title pt will be able to acend and decend 8 stairs in step over step pattern without AD use or railing, independently, in order to have access to second story of home   Time 4   Period Weeks   Status Achieved   PT LONG TERM GOAL #5   Title pt will be able to carry a bag of groceries in both  hands and ascend and descend 8 steps in step over step pattern safely    Time 4   Period Weeks   Status Achieved   PT LONG TERM GOAL #6   Title pt will improve 6 min walk test by 133ft in order to [employer] mobility.    Baseline 1173ft/1375ft   Time 4   Period Weeks   Status Achieved               Plan - 12/20/15 1723    Clinical Impression Statement pt has made great progress regarding her strength, balance and gait. pt has achieved PT goals at this time and will be DC to independent HEP for independent rehab.    Rehab Potential Excellent   PT Frequency 2x / week   PT Duration 4 weeks   PT Treatment/Interventions ADLs/Self Care Home Management;Electrical Stimulation;Iontophoresis /ml Dexamethasone;Ultrasound;Gait training;Stair training;Functional mobility training;Therapeutic exercise;Balance training;Neuromuscular re-education;Patient/family education;Manual techniques;Energy conservation   PT Next Visit Plan progression of therapeutic exercise    PT Home Exercise Plan sit to stand, calf raises, lateral band walks, marches   Consulted and Agree with Plan of Care Patient      Patient will benefit from skilled therapeutic intervention in order to improve the following deficits and impairments:  Abnormal gait, Decreased balance, Decreased endurance, Decreased strength, Difficulty walking, Impaired sensation  Visit Diagnosis: Muscle weakness (generalized)     Problem List Patient Active Problem List   Diagnosis Date Noted  . 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 Hypertensive urgency 09/27/2015  . TIA (transient ischemic attack) 09/27/2015   Carlyon Shadow. Tasha Diaz, PT, DPT [phone]   Hailey Stormer 12/20/2015, 5:24 PM   [facility] MAIN [facility] SERVICES [address] [place], [place], [address] Phone: [phone]   Fax:  [phone]  Name: Stephanie Hawkins MRN: [id number] Date of Birth: 07/17/1971

## 2015-12-20 NOTE — Patient Instructions (Signed)
OT TREATMENT    Neuro muscular re-education: pt. worked on grasping activities incorporating thumb to 4th and 5th digits.  Therapeutic Exercise: pt. worked grasping resistive clips with yellow, red, green, and blue resistive clips. Emphasis was placed on grasping with the 4th and 5th digits, and reaching to place the clips at various angles and positions.  Selfcare: Pt. worked on Office managertyping and formatting a menu with various type styles.   Manual Therapy:

## 2015-12-20 NOTE — Therapy (Signed)
[place] MAIN [facility] SERVICES [address] [place], [place], [address] Phone: [phone]   Fax:  [phone]  Occupational Therapy Treatment  Patient Details  Name: Stephanie Hawkins MRN: [id number] Date of Birth: 11-Feb-1971 Referring Provider: Manuella Ghazi  Encounter Date: 12/20/2015      OT End of Session - 12/20/15 1719    Visit Number 17   Number of Visits 36   Date for OT Re-Evaluation 02/07/16   OT Start Time 1602   OT Stop Time 1650   OT Time Calculation (min) 48 min   Activity Tolerance Patient tolerated treatment well   Behavior During Therapy [facility] for tasks assessed/performed      Past Medical History  Diagnosis Date  . Hypertension   . Stroke [facility])     Past Surgical History  Procedure Laterality Date  . Tubal ligation    . Tee without cardioversion N/A 09/29/2015    Procedure: Transesophageal Echocardiogram (Tee);  Surgeon: Dionisio David, MD;  Location: [facility] ORS;  Service: Cardiovascular;  Laterality: N/A;    There were no vitals filed for this visit.      Subjective Assessment - 12/20/15 1718    Subjective  Pt. reports she has returned to work.   Patient is accompained by: Family member   Pertinent History Stephanie Hawkins is a 45 y.o. female with a known history of hypertension admitted to [facility] on 09/27/2015 with left lower extremity weakness. Patient woke up that morning was trying to go to the bathroom to brush her teeth. She felt weak in her left leg and also noticed some weakness in the left upper extremity. Patient says her left leg was weaker than the left upper extremity when she first noticed it. No history of any slurred speech. Patient had not been taking blood pressure medication for the last 1 year. Has not followed up with any primary care physician for the last few years. When patient presented to the emergency room her blood pressure was high and systolic blood pressure was more than 210 mm of Hg and diastolic pressure  was around 160 mm of Hg. Initial CT scan of the head did not show any intracranial abnormality. No history of any headache dizziness or blurry vision. No history of any fall or any seizures. Pt reports full independence with community mobility prior to arrival. 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 Brain MRI showed small acute lacunar CVA R posterior corona radiata to R putamen as well as chronic L CVA. Since discharge she has not been able to perform higher level IADL tasks such as laundry, cooking, cleaning.  She attempted to perform laundry and had to sit to fold clothes. Her church family has been bringing in meals.    Patient Stated Goals Patient wants to be independent with all tasks and return to work as soon as possible.    Currently in Pain? No/denies   Pain Score 0-No pain        OT TREATMENT    Neuro muscular re-education: pt. worked on grasping activities incorporating thumb to 4th and 5th digits.  Therapeutic Exercise: pt. worked grasping resistive clips with yellow, red, green, and blue resistive clips. Emphasis was placed on grasping with the 4th and 5th digits, and reaching to place the clips at various angles and positions.  Selfcare: Pt. worked on Radio broadcast assistant a menu with various type styles.  OT Education - 12/20/15 1719    Person(s) Educated Patient   Methods Explanation   Comprehension Verbalized understanding             OT Long Term Goals - 12/13/15 1604    OT LONG TERM GOAL #1   Title Patient will improve LUE strength by 1 mm grade to demonstrate lifting of pot filled with water to be independent with cooking.   Baseline unable to lift pots at evaluation especially if filled.    Time 8   Period Weeks   Status Partially Met   Long Term Additional Goals   Additional Long Term Goals Yes   OT LONG TERM GOAL #6   Title Patient will increase grip  strength on L hand by 3 pounds to be able to open jars and containers.    Baseline right grip 66#, left 39# at eval   Time 8   Period Weeks   Status Revised   OT LONG TERM GOAL #7   Title Pt. will improve Left UE control to be able to independently pick up items off the floor quickly.   Baseline Pt. has difficulty.   Time 8   Status New   OT LONG TERM GOAL #8   Title Pt. will independently perform hair care while sustaining left UE in elevation the full duration without rest breaks.   Time 8   Period Weeks   Status New   OT LONG TERM GOAL  #10   TITLE Pt. will improve typing speed with typing a 6 sentence paragraph by 1 min.   Baseline 5 min. & 35 sec.   1 sentence:1 min. & 24 sec.   Time 8   Period Weeks   Status New               Plan - 12/20/15 1721    Clinical Impression Statement Pt. continues to progress with her LUE and hand. Pt. is now improving left hand control when reaching down to pick up objects, and reaching behind her. Pt. continues to fatique sustaining UE elevation for a duration while fixing her hair. Pt. continues to work on improving typing, and getting her left 4th and 5th digits to work together.   Rehab Potential Excellent   OT Frequency 2x / week   OT Duration 8 weeks   OT Treatment/Interventions Self-care/ADL training;Therapeutic exercise;Functional Mobility Training;Patient/family education;Neuromuscular education;Manual Therapy;Balance training;Therapeutic exercises;DME and/or AE instruction;Therapeutic activities   Consulted and Agree with Plan of Care Patient      Patient will benefit from skilled therapeutic intervention in order to improve the following deficits and impairments:  Decreased endurance, Decreased activity tolerance, Decreased knowledge of use of DME, Decreased strength, Decreased balance, Decreased mobility, Pain, Decreased coordination, Impaired UE functional use  Visit Diagnosis: Other lack of coordination    Problem  List Patient Active Problem List   Diagnosis Date Noted  . Cerebrovascular accident (CVA) due to stenosis of cerebral artery ([place])   . Hypertensive urgency 09/27/2015  . TIA (transient ischemic attack) 09/27/2015   Harrel Carina, MS, OTR/L  Harrel Carina 12/20/2015, 5:34 PM  [place] MAIN [facility] SERVICES [address] [place], [place], [address] Phone: [phone]   Fax:  [phone]  Name: Stephanie Hawkins MRN: [id number] Date of Birth: 12-19-1970

## 2015-12-22 ENCOUNTER — Ambulatory Visit: Payer: BC Managed Care – PPO

## 2015-12-22 ENCOUNTER — Ambulatory Visit: Payer: BC Managed Care – PPO | Admitting: Occupational Therapy

## 2015-12-22 DIAGNOSIS — R278 Other lack of coordination: Secondary | ICD-10-CM

## 2015-12-22 NOTE — Therapy (Signed)
[place] MAIN [facility] SERVICES [address] [place], [place], [address] Phone: [phone]   Fax:  [phone]  Occupational Therapy Treatment  Patient Details  Name: Stephanie Hawkins MRN: [id number] Date of Birth: [birth year] Referring Provider: Manuella Ghazi  Encounter Date: 12/22/2015      OT End of Session - 12/22/15 1632    Visit Number 18   Number of Visits 36   Date for OT Re-Evaluation 02/07/16   OT Start Time 1604   OT Stop Time 1645   OT Time Calculation (min) 41 min   Activity Tolerance Patient tolerated treatment well   Behavior During Therapy [employer] for tasks assessed/performed      Past Medical History  Diagnosis Date  . Hypertension   . Stroke [facility])     Past Surgical History  Procedure Laterality Date  . Tubal ligation    . Tee without cardioversion N/A 09/29/2015    Procedure: Transesophageal Echocardiogram (Tee);  Surgeon: Dionisio David, MD;  Location: [facility] ORS;  Service: Cardiovascular;  Laterality: N/A;    There were no vitals filed for this visit.      Subjective Assessment - 12/22/15 1606    Subjective  Pt. is back to work 1/2 days for 2 weeks, and then gradually increasing hours back to fulltime.   Pertinent History Stephanie Hawkins is a 45 y.o. female with a known history of hypertension admitted to [facility] on 09/27/2015 with left lower extremity weakness. Patient woke up that morning was trying to go to the bathroom to brush her teeth. She felt weak in her left leg and also noticed some weakness in the left upper extremity. Patient says her left leg was weaker than the left upper extremity when she first noticed it. No history of any slurred speech. Patient had not been taking blood pressure medication for the last 1 year. Has not followed up with any primary care physician for the last few years. When patient presented to the emergency room her blood pressure was high and systolic blood pressure was more than 210 mm of Hg and  diastolic pressure was around 160 mm of Hg. Initial CT scan of the head did not show any intracranial abnormality. No history of any headache dizziness or blurry vision. No history of any fall or any seizures. Pt reports full independence with community mobility prior to arrival. 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 Brain MRI showed small acute lacunar CVA R posterior corona radiata to R putamen as well as chronic L CVA. Since discharge she has not been able to perform higher level IADL tasks such as laundry, cooking, cleaning.  She attempted to perform laundry and had to sit to fold clothes. Her church family has been bringing in meals.    Limitations Standing IADL tasks, UE strength and impaired coordination and hand function.   Patient Stated Goals Patient wants to be independent with all tasks and return to work as soon as possible.    Currently in Pain? No/denies   Pain Score 0-No pain      OT TREATMENT     Neuro muscular re-education: Pt. Worked on grasping and flipping objects with the left hand while placed on the floor to the left to work on grasping and manipulating items from the floor, and to simulate having to reaching and handling objects in the back of the car. Pt. Worked on working with tweezers, grasping and placing  1/8" objects. Pt. Had difficulty manipulating and placing the objects. Pt.'s hand fatigued requiring rest breaks.  Therapeutic Exercise: Pt worked out on [employer] for 5 min. With constant monitoring of her LUE and challenged with distraction. Pt. Was able to keep her LUE on the handle throughout the duration of the exercise.                                 OT Long Term Goals - 12/13/15 1604    OT LONG TERM GOAL #1   Title Patient will improve LUE strength by 1 mm grade to demonstrate lifting of pot filled with water to be independent with cooking.   Baseline unable to lift pots  at evaluation especially if filled.    Time 8   Period Weeks   Status Partially Met   Long Term Additional Goals   Additional Long Term Goals Yes   OT LONG TERM GOAL #6   Title Patient will increase grip strength on L hand by 3 pounds to be able to open jars and containers.    Baseline right grip 66#, left 39# at eval   Time 8   Period Weeks   Status Revised   OT LONG TERM GOAL #7   Title Pt. will improve Left UE control to be able to independently pick up items off the floor quickly.   Baseline Pt. has difficulty.   Time 8   Status New   OT LONG TERM GOAL #8   Title Pt. will independently perform hair care while sustaining left UE in elevation the full duration without rest breaks.   Time 8   Period Weeks   Status New   OT LONG TERM GOAL  #10   TITLE Pt. will improve typing speed with typing a 6 sentence paragraph by 1 min.   Baseline 5 min. & 35 sec.   1 sentence:1 min. & 24 sec.   Time 8   Period Weeks   Status New               Plan - 12/22/15 1638    Clinical Impression Statement Pt. is making progress, and is improving LUE strength, and left hand coordination overall. Pt. continues to present with difficulty using/atlternating her left 4th and 5th digits during refined ADL tasks. Pt. also has difficulty working with her left hand when reaching back, or grasping items from the floor, or when reaching in the back of her car. pt. gets a headache when having to concentrate on [facility] tasks.     Rehab Potential Excellent   OT Frequency 2x / week   OT Duration 8 weeks   OT Treatment/Interventions Self-care/ADL training;Therapeutic exercise;Functional Mobility Training;Patient/family education;Neuromuscular education;Manual Therapy;Balance training;Therapeutic exercises;DME and/or AE instruction;Therapeutic activities      Patient will benefit from skilled therapeutic intervention in order to improve the following deficits and impairments:  Decreased endurance, Decreased  activity tolerance, Decreased knowledge of use of DME, Decreased strength, Decreased balance, Decreased mobility, Pain, Decreased coordination, Impaired UE functional use  Visit Diagnosis: Other lack of coordination    Problem List Patient Active Problem List   Diagnosis Date Noted  . Cerebrovascular accident (CVA) due to stenosis of cerebral artery ([place])   . Hypertensive urgency 09/27/2015  . TIA (transient ischemic attack) 09/27/2015   Harrel Carina, MS, OTR/L  Harrel Carina 12/22/2015, 5:01 PM  [place] MAIN [facility] SERVICES  [place], [place], [address] Phone: [phone]   Fax:  [phone]  Name: Stephanie Hawkins MRN: [id number] Date of Birth: 1970/12/26

## 2015-12-22 NOTE — Patient Instructions (Signed)
OT TREATMENT     Neuro muscular re-education: Pt. Worked on grasping and flipping objects with the left hand while placed on the floor to the left to work on grasping and manipulating items from the floor, and to simulate having to reaching and handling objects in the back of the car. Pt. Worked on working with tweezers, grasping and placing 1/8" objects. Pt. Had difficulty manipulating and placing the objects. Pt.'s hand fatigued requiring rest breaks.  Therapeutic Exercise: Pt worked out on [employer]if[employer] for 5 min. With constant monitoring of her LUE and challenged with distraction. Pt. Was able to keep her LUE on the handle throughout the duration of the exercise.

## 2015-12-27 ENCOUNTER — Ambulatory Visit: Payer: BC Managed Care – PPO

## 2015-12-27 ENCOUNTER — Ambulatory Visit: Payer: BC Managed Care – PPO | Admitting: Occupational Therapy

## 2015-12-27 DIAGNOSIS — R278 Other lack of coordination: Secondary | ICD-10-CM | POA: Diagnosis not present

## 2015-12-27 NOTE — Therapy (Signed)
[place] MAIN [facility] SERVICES [address] [place], [place], [address] Phone: [phone]   Fax:  [phone]  Occupational Therapy Treatment  Patient Details  Name: Stephanie Hawkins MRN: [id number] Date of Birth: [birth year] Referring Provider: Manuella Ghazi  Encounter Date: 12/27/2015      OT End of Session - 12/27/15 1706    Visit Number 19   Number of Visits 36   Date for OT Re-Evaluation 02/07/16   OT Start Time 1618   OT Stop Time 1650   OT Time Calculation (min) 32 min   Activity Tolerance Patient tolerated treatment well   Behavior During Therapy [facility] for tasks assessed/performed      Past Medical History  Diagnosis Date  . Hypertension   . Stroke [facility])     Past Surgical History  Procedure Laterality Date  . Tubal ligation    . Tee without cardioversion N/A 09/29/2015    Procedure: Transesophageal Echocardiogram (Tee);  Surgeon: Dionisio David, MD;  Location: [facility] ORS;  Service: Cardiovascular;  Laterality: N/A;    There were no vitals filed for this visit.      Subjective Assessment - 12/27/15 1633    Subjective  Pt. reports she is now able to do her hair, while keeping her LUE elevated.   Pertinent History Stephanie Hawkins is a 45 y.o. female with a known history of hypertension admitted to [facility] on 09/27/2015 with left lower extremity weakness. Patient woke up that morning was trying to go to the bathroom to brush her teeth. She felt weak in her left leg and also noticed some weakness in the left upper extremity. Patient says her left leg was weaker than the left upper extremity when she first noticed it. No history of any slurred speech. Patient had not been taking blood pressure medication for the last 1 year. Has not followed up with any primary care physician for the last few years. When patient presented to the emergency room her blood pressure was high and systolic blood pressure was more than 210 mm of Hg and diastolic pressure was  around 160 mm of Hg. Initial CT scan of the head did not show any intracranial abnormality. No history of any headache dizziness or blurry vision. No history of any fall or any seizures. Pt reports full independence with community mobility prior to arrival. 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 Brain MRI showed small acute lacunar CVA R posterior corona radiata to R putamen as well as chronic L CVA. Since discharge she has not been able to perform higher level IADL tasks such as laundry, cooking, cleaning.  She attempted to perform laundry and had to sit to fold clothes. Her church family has been bringing in meals.    Patient Stated Goals Patient wants to be independent with all tasks and return to work as soon as possible.    Currently in Pain? No/denies   Pain Score 0-No pain        OT TREATMENT    Neuro muscular re-education: Pt. Worked on grasping and flipping 1 inch pegs, and placing them at various angles positioned to simulate picking up items from the floor, and from the back seat of a car. Pt. Worked on  More detailed intricate [facility] tasks with the left grasping 1/8" objects with tweezers.. Pt. did present with shaking, and motor control issues with intricate [place] activities.   Selfcare: Pt. worked on threading  a needle while holding the thread with her left hand, and the needle with her right hand. Pt. was able to complete it with increased time, and several tries. Cues were required for positioning.                              OT Long Term Goals - 12/13/15 1604    OT LONG TERM GOAL #1   Title Patient will improve LUE strength by 1 mm grade to demonstrate lifting of pot filled with water to be independent with cooking.   Baseline unable to lift pots at evaluation especially if filled.    Time 8   Period Weeks   Status Partially Met   Long Term Additional Goals   Additional Long Term Goals Yes   OT  LONG TERM GOAL #6   Title Patient will increase grip strength on L hand by 3 pounds to be able to open jars and containers.    Baseline right grip 66#, left 39# at eval   Time 8   Period Weeks   Status Revised   OT LONG TERM GOAL #7   Title Pt. will improve Left UE control to be able to independently pick up items off the floor quickly.   Baseline Pt. has difficulty.   Time 8   Status New   OT LONG TERM GOAL #8   Title Pt. will independently perform hair care while sustaining left UE in elevation the full duration without rest breaks.   Time 8   Period Weeks   Status New   OT LONG TERM GOAL  #10   TITLE Pt. will improve typing speed with typing a 6 sentence paragraph by 1 min.   Baseline 5 min. & 35 sec.   1 sentence:1 min. & 24 sec.   Time 8   Period Weeks   Status New               Plan - 12/27/15 1706    Clinical Impression Statement Pt. is making progress with [facility] skills, and continues to work on improving left hand coordination when reaching to pick something up off the floor, and when reaching behind her for items in the back seat of the car. Pt. requires continued work on [facility] tasks requiring intricate/detailed work and attention.     OT Frequency 2x / week   OT Treatment/Interventions Self-care/ADL training;Therapeutic exercise;Functional Mobility Training;Patient/family education;Neuromuscular education;Manual Therapy;Balance training;Therapeutic exercises;DME and/or AE instruction;Therapeutic activities   Consulted and Agree with Plan of Care Patient      Patient will benefit from skilled therapeutic intervention in order to improve the following deficits and impairments:  Decreased endurance, Decreased activity tolerance, Decreased knowledge of use of DME, Decreased strength, Decreased balance, Decreased mobility, Pain, Decreased coordination, Impaired UE functional use  Visit Diagnosis: Other lack of coordination    Problem List Patient Active Problem List    Diagnosis Date Noted  . Cerebrovascular accident (CVA) due to stenosis of cerebral artery ([place])   . Hypertensive urgency 09/27/2015  . TIA (transient ischemic attack) 09/27/2015   Harrel Carina, MS, OTR/L  Harrel Carina 12/27/2015, 5:12 PM  [place] MAIN [facility] SERVICES [address] [place], [place], [address] Phone: [phone]   Fax:  [phone]  Name: Stephanie Hawkins MRN: [id number] Date of Birth: 08-25-1971

## 2015-12-27 NOTE — Patient Instructions (Signed)
OT TREATMENT    Neuro muscular re-education: Pt. Worked on grasping and flipping 1 inch pegs, and placing them at various angles positioned to simulate picking up items from the floor, and from the back seat of a car. Pt. Worked on  More detailed intricate [facility] tasks with the left grasping 1/8" objects with tweezers.. Pt. did present with shaking, and motor control issues with intricate [facility] activities.   Selfcare: Pt. worked on threading a needle while holding the thread with her left hand, and the needle with her right hand. Pt. was able to complete it with increased time, and several tries.

## 2015-12-29 ENCOUNTER — Ambulatory Visit: Payer: BC Managed Care – PPO

## 2015-12-29 ENCOUNTER — Ambulatory Visit: Payer: BC Managed Care – PPO | Admitting: Occupational Therapy

## 2015-12-29 DIAGNOSIS — R278 Other lack of coordination: Secondary | ICD-10-CM

## 2015-12-29 NOTE — Patient Instructions (Signed)
OT TREATMENT    Neuro muscular re-education: pt. worked on [facility] tasks manipulating 1/8" objects, alternating thumb to 4th and 5th digits. Pt. Worked on grasping resistive clips and simulated reaching to the back left to place the clips, as well as low near ground level. Pt. Worked on lateral pinch grasping with tweezers, with cues for wrist extension.  Therapeutic Exercise: Pt. utilized the Scifit. for 8 min. Forward/reverse with distraction. Constant monitoring of the left hand was required the entire duration.

## 2015-12-29 NOTE — Therapy (Signed)
[place] MAIN [facility] SERVICES [address] [place], [place], [address] Phone: [phone]   Fax:  [phone]  Occupational Therapy Treatment  Patient Details  Name: Stephanie Hawkins MRN: [id number] Date of Birth: 06/03/1971 Referring Provider: Manuella Ghazi  Encounter Date: 12/29/2015      OT End of Session - 12/29/15 1645    Visit Number 20   Number of Visits 36   Date for OT Re-Evaluation 02/07/16   OT Start Time 1620   OT Stop Time 1700   OT Time Calculation (min) 40 min   Activity Tolerance Patient tolerated treatment well   Behavior During Therapy [facility] for tasks assessed/performed      Past Medical History  Diagnosis Date  . Hypertension   . Stroke [facility])     Past Surgical History  Procedure Laterality Date  . Tubal ligation    . Tee without cardioversion N/A 09/29/2015    Procedure: Transesophageal Echocardiogram (Tee);  Surgeon: Dionisio David, MD;  Location: [facility] ORS;  Service: Cardiovascular;  Laterality: N/A;    There were no vitals filed for this visit.      Subjective Assessment - 12/29/15 1740    Subjective  Pt. reports she will have Thyroid surgery next month.   Limitations Standing IADL tasks, UE strength and impaired coordination and hand function.   Patient Stated Goals Patient wants to be independent with all tasks.    Currently in Pain? No/denies   Pain Score 0-No pain       .OT TREATMENT    Neuro muscular re-education: pt. worked on [facility] tasks manipulating 1/8" objects, alternating thumb to 4th and 5th digits. Pt. Worked on grasping resistive clips and simulated reaching to the back left to place the clips, as well as low near ground level. Pt. Worked on lateral pinch grasping with tweezers, with cues for wrist extension.  Therapeutic Exercise: Pt. utilized the Scifit. for 8 min. Forward/reverse with distraction. Constant monitoring of the left hand was required the entire  duration.                        OT Education - 12/29/15 1644    Education provided Yes   Person(s) Educated Patient   Methods Explanation   Comprehension Verbalized understanding             OT Long Term Goals - 12/13/15 1604    OT LONG TERM GOAL #1   Title Patient will improve LUE strength by 1 mm grade to demonstrate lifting of pot filled with water to be independent with cooking.   Baseline unable to lift pots at evaluation especially if filled.    Time 8   Period Weeks   Status Partially Met   Long Term Additional Goals   Additional Long Term Goals Yes   OT LONG TERM GOAL #6   Title Patient will increase grip strength on L hand by 3 pounds to be able to open jars and containers.    Baseline right grip 66#, left 39# at eval   Time 8   Period Weeks   Status Revised   OT LONG TERM GOAL #7   Title Pt. will improve Left UE control to be able to independently pick up items off the floor quickly.   Baseline Pt. has difficulty.   Time 8   Status New   OT LONG TERM GOAL #8   Title Pt. will independently perform hair care while sustaining left UE  in elevation the full duration without rest breaks.   Time 8   Period Weeks   Status New   OT LONG TERM GOAL  #10   TITLE Pt. will improve typing speed with typing a 6 sentence paragraph by 1 min.   Baseline 5 min. & 35 sec.   1 sentence:1 min. & 24 sec.   Time 8   Period Weeks   Status New               Plan - 12/29/15 1742    Clinical Impression Statement pt. continues to make improvements with LUE and hand strength and coordination. Pt. continues to require work on improving left hand use when reaching for objects from the floor, or behind her to her back seat. Pt. also continues to work on tasks that require intricate detail with the left hand. Pt. continues to work on improving the 4th and 5th digit strength, and coordination.    OT Frequency 2x / week   OT Duration 8 weeks   OT  Treatment/Interventions Self-care/ADL training;Therapeutic exercise;Functional Mobility Training;Patient/family education;Neuromuscular education;Manual Therapy;Balance training;Therapeutic exercises;DME and/or AE instruction;Therapeutic activities   Consulted and Agree with Plan of Care Patient      Patient will benefit from skilled therapeutic intervention in order to improve the following deficits and impairments:  Decreased endurance, Decreased activity tolerance, Decreased knowledge of use of DME, Decreased strength, Decreased balance, Decreased mobility, Pain, Decreased coordination, Impaired UE functional use  Visit Diagnosis: Other lack of coordination    Problem List Patient Active Problem List   Diagnosis Date Noted  . 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 Hypertensive urgency 09/27/2015  . TIA (transient ischemic attack) 09/27/2015   Harrel Carina, MS, OTR/L  Harrel Carina 12/29/2015, 5:53 PM  [place] MAIN [facility] SERVICES [address] [place], [place], [address] Phone: [phone]   Fax:  [phone]  Name: Kennie Karapetian MRN: [id number] Date of Birth: 1970/11/26

## 2016-01-02 ENCOUNTER — Ambulatory Visit: Payer: BC Managed Care – PPO | Attending: Neurology | Admitting: Occupational Therapy

## 2016-01-02 DIAGNOSIS — R278 Other lack of coordination: Secondary | ICD-10-CM | POA: Insufficient documentation

## 2016-01-04 ENCOUNTER — Ambulatory Visit: Payer: BC Managed Care – PPO | Admitting: Occupational Therapy

## 2016-01-04 DIAGNOSIS — R278 Other lack of coordination: Secondary | ICD-10-CM

## 2016-01-04 NOTE — Patient Instructions (Addendum)
OT TREATMENT    Neuro muscular re-education: Pt. performed [facility] skills training to improve speed and dexterity needed for ADL tasks and writing. Pt. demonstrated grasping 1 inch sticks,  inch cylindrical collars, and  inch flat washers on the [employer] pegboard. Pt. performed grasping each item with her 2nd digit and thumb, and storing them in the palm. Pt. worked on alternating Right and Left hands with increasing speed.Pt. Worked on thumb opposition with the 2nd through 5th digits.Pt. had difficulty grasping, and manipulating objects in the left hand today.   Therapeutic Exercise: Pt. worked on UE strengthening using the [employer]if[employer]/UBE for 10 min. With changing directions every 2 min. Constant monitoring was provided of the LUE and hand on the handle with and without distraction.

## 2016-01-04 NOTE — Therapy (Signed)
[place] MAIN [facility] SERVICES [address] [place], [place], [address] Phone: [phone]   Fax:  [phone]  Occupational Therapy Treatment/Progress Report  Patient Details  Name: Stephanie Hawkins MRN: [id number] Date of Birth: [birth year] Referring Provider: Manuella Ghazi  Encounter Date: 01/04/2016      OT End of Session - 01/04/16 1633    Visit Number 21   Number of Visits 36   Date for OT Re-Evaluation 02/07/16   Authorization Type Last Progress Report 01/04/2016   OT Start Time 1603   OT Stop Time 1645   OT Time Calculation (min) 42 min   Activity Tolerance Patient tolerated treatment well   Behavior During Therapy [facility] for tasks assessed/performed      Past Medical History  Diagnosis Date  . Hypertension   . Stroke [facility])     Past Surgical History  Procedure Laterality Date  . Tubal ligation    . Tee without cardioversion N/A 09/29/2015    Procedure: Transesophageal Echocardiogram (Tee);  Surgeon: Dionisio David, MD;  Location: [facility] ORS;  Service: Cardiovascular;  Laterality: N/A;    There were no vitals filed for this visit.      Subjective Assessment - 01/04/16 1630    Subjective  Pt. will have Thyroid Surgery on 01/19/2016   Patient is accompained by: Family member   Pertinent History Stephanie Hawkins is a 45 y.o. female with a known history of hypertension admitted to [facility] on 09/27/2015 with left lower extremity weakness. Patient woke up that morning was trying to go to the bathroom to brush her teeth. She felt weak in her left leg and also noticed some weakness in the left upper extremity. Patient says her left leg was weaker than the left upper extremity when she first noticed it. No history of any slurred speech. Patient had not been taking blood pressure medication for the last 1 year. Has not followed up with any primary care physician for the last few years. When patient presented to the emergency room her blood pressure was high and  systolic blood pressure was more than 210 mm of Hg and diastolic pressure was around 160 mm of Hg. Initial CT scan of the head did not show any intracranial abnormality. No history of any headache dizziness or blurry vision. No history of any fall or any seizures. Pt reports full independence with community mobility prior to arrival. 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 Brain MRI showed small acute lacunar CVA R posterior corona radiata to R putamen as well as chronic L CVA. Since discharge she has not been able to perform higher level IADL tasks such as laundry, cooking, cleaning.  She attempted to perform laundry and had to sit to fold clothes. Her church family has been bringing in meals.    Patient Stated Goals Patient wants to be independent with all tasks.    Currently in Pain? No/denies   Pain Score 0-No pain      OT TREATMENT    Neuro muscular re-education: Pt. performed [facility] skills training to improve speed and dexterity needed for ADL tasks and writing. Pt. demonstrated grasping 1 inch sticks,  inch cylindrical collars, and  inch flat washers on the [employer] pegboard. Pt. performed grasping each item with her 2nd digit and thumb, and storing them in the palm. Pt. worked on alternating Right and Left hands with increasing speed.Pt. Worked on thumb opposition with the 2nd  through 5th digits.Pt. had difficulty grasping, and manipulating objects in the left hand today.   Therapeutic Exercise: Pt. worked on UE strengthening using the Sc[employer]/UBE for 10 min. With changing directions every 2 min. Constant monitoring was provided of the LUE and hand on the handle with and without distraction.                                    OT Long Term Goals - 01/04/16 1704    OT LONG TERM GOAL #1   Title Patient will improve LUE strength by 1 mm grade to demonstrate lifting of pot filled with water to be independent with  cooking.   Baseline unable to lift pots at evaluation especially if filled.    Time 8   Period Weeks   Status Partially Met   OT LONG TERM GOAL #2   Title Patient will improve LUE grip and strength sufficient to carry full laundry basket of clothes safely to laundry room using BUE.     Baseline unable at evaluation.   Time 8   Period Weeks   Status Partially Met   OT LONG TERM GOAL #3   Title Patient will be able to carry light weight items up and down stairs with modified technique and good balance.    Baseline unable at evaluation   Time 8   Period Weeks   Status Not Met   OT LONG TERM GOAL #4   Title Patient will be able to stand up to 20 minutes to wash dishes and/or fold laundry.     Baseline has to sit to complete laundry folding.   Time 8   Period Weeks   Status Partially Met   OT LONG TERM GOAL #5   Title Patient will demonstrate typing of 3 paragraphs in 15 minutes with less than 5 errors.    Baseline slow to type   Time 8   Status Partially Met   OT LONG TERM GOAL #6   Title Patient will increase grip strength on L hand by 3 pounds to be able to open jars and containers.    Baseline right grip 66#, left 39# at eval   Time 8   Period Weeks   Status Partially Met   OT LONG TERM GOAL #7   Title Pt. will improve Left UE control to be able to independently pick up items off the floor quickly.   Baseline Pt. has difficulty.   Time 8   Period Weeks   Status Partially Met   OT LONG TERM GOAL #8   Title Pt. will independently perform hair care while sustaining left UE in elevation the full duration without rest breaks.   Baseline Pt. left UE fatigues   Period Weeks   Status Partially Met   OT LONG TERM GOAL  #10   TITLE Pt. will improve typing speed with typing a 6 sentence paragraph by 1 min.   Baseline 5 min. & 35 sec.   1 sentence:1 min. & 24 sec.   Period Weeks   Status Partially Met               Plan - 01/04/16 1659    Clinical Impression Statement  Pt. continues to improve with left UE strength, and hand function. Pt. requires continued work on improving in hand coordination skills, thumb opposition with the 4th and 5th digits. Pt. requires continued work on improving  left hand use when reaching for items in the back seat or from the floor.   Rehab Potential Excellent   OT Duration 8 weeks   OT Treatment/Interventions Self-care/ADL training;Therapeutic exercise;Functional Mobility Training;Patient/family education;Neuromuscular education;Manual Therapy;Balance training;Therapeutic exercises;DME and/or AE instruction;Therapeutic activities   Consulted and Agree with Plan of Care Patient      Patient will benefit from skilled therapeutic intervention in order to improve the following deficits and impairments:  Decreased endurance, Decreased activity tolerance, Decreased knowledge of use of DME, Decreased strength, Decreased balance, Decreased mobility, Pain, Decreased coordination, Impaired UE functional use  Visit Diagnosis: Other lack of coordination    Problem List Patient Active Problem List   Diagnosis Date Noted  . Cerebrovascular accident (CVA) due to stenosis of cerebral artery ([place])   . Hypertensive urgency 09/27/2015  . TIA (transient ischemic attack) 09/27/2015   Harrel Carina, MS, OTR/L   Harrel Carina 01/04/2016, 5:09 PM  [place] MAIN [facility] SERVICES [address] [place], [place], [address] Phone: [phone]   Fax:  [phone]  Name: Stephanie Hawkins MRN: [id number] Date of Birth: 08/21/1971

## 2016-01-10 ENCOUNTER — Ambulatory Visit: Payer: BC Managed Care – PPO | Admitting: Occupational Therapy

## 2016-01-10 DIAGNOSIS — R278 Other lack of coordination: Secondary | ICD-10-CM

## 2016-01-10 NOTE — Patient Instructions (Addendum)
OT TREATMENT    Neuro muscular re-education: Pt. worked on grasping 1/8" objects with tweezers with more control in the  Left hand. Pt. worked on translatory movements of the hand, and worked on Conservation officer, naturegrasping small objects with the 2nd through 5th digits. Pt. worked on typing tasks with improving speed.  Therapeutic Exercise: Pt. worked out on the [employer] for [employer]UE strengthening for 8 min. With continuous monitoring of the LUE on the handle. Pt. Was able to keep her hand on the handle the entire duration with distraction. Pt. performed gross gripping with grip strengthener. Pt. worked on sustaining grip while grasping pegs and reaching at various heights, down low, and to the back to simulate reaching to the back seat.

## 2016-01-10 NOTE — Therapy (Signed)
[place] MAIN [facility] SERVICES [address] [place], [place], [address] Phone: [phone]   Fax:  [phone]  Occupational Therapy Treatment  Patient Details  Name: Stephanie Hawkins MRN: [id number] Date of Birth: [birth year] Referring Provider: Manuella Ghazi  Encounter Date: 01/10/2016      OT End of Session - 01/10/16 4967    Visit Number 22   Number of Visits 58   Date for OT Re-Evaluation 02/07/16   Authorization Type Last Progress Report 01/04/2016      Past Medical History  Diagnosis Date  . Hypertension   . Stroke [facility])     Past Surgical History  Procedure Laterality Date  . Tubal ligation    . Tee without cardioversion N/A 09/29/2015    Procedure: Transesophageal Echocardiogram (Tee);  Surgeon: Dionisio David, MD;  Location: [facility] ORS;  Service: Cardiovascular;  Laterality: N/A;    There were no vitals filed for this visit.      Subjective Assessment - 01/10/16 1650    Subjective  Pt. is planning to have Thyroid surgery on 01/19/2016   Patient is accompained by: Family member   Pertinent History Stephanie Hawkins is a 45 y.o. female with a known history of hypertension admitted to [facility] on 09/27/2015 with left lower extremity weakness. Patient woke up that morning was trying to go to the bathroom to brush her teeth. She felt weak in her left leg and also noticed some weakness in the left upper extremity. Patient says her left leg was weaker than the left upper extremity when she first noticed it. No history of any slurred speech. Patient had not been taking blood pressure medication for the last 1 year. Has not followed up with any primary care physician for the last few years. When patient presented to the emergency room her blood pressure was high and systolic blood pressure was more than 210 mm of Hg and diastolic pressure was around 160 mm of Hg. Initial CT scan of the head did not show any intracranial abnormality. No history of any headache  dizziness or blurry vision. No history of any fall or any seizures. Pt reports full independence with community mobility prior to arrival. 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 Brain MRI showed small acute lacunar CVA R posterior corona radiata to R putamen as well as chronic L CVA. Since discharge she has not been able to perform higher level IADL tasks such as laundry, cooking, cleaning.  She attempted to perform laundry and had to sit to fold clothes. Her church family has been bringing in meals.    Patient Stated Goals Patient wants to be independent with all tasks.    Currently in Pain? No/denies   Pain Score 0-No pain           OT TREATMENT    Neuro muscular re-education: Pt. worked on grasping 1/8" objects with tweezers with more control in the  Left hand. Pt. worked on translatory movements of the hand, and worked on Retail banker with the 2nd through 5th digits. Pt. worked on typing tasks with improving speed.  Therapeutic Exercise: Pt. worked out on the [employer] for [employer] for 8 min. With continuous monitoring of the LUE on the handle. Pt. Was able to keep her hand on the handle the entire duration with distraction. Pt. performed gross gripping with grip strengthener. Pt. worked on sustaining grip while grasping pegs and reaching at  various heights, down low, and to the back to simulate reaching to the back seat.                        OT Education - 01/10/16 1651    Education provided Yes   Person(s) Educated Patient   Methods Explanation   Comprehension Verbalized understanding             OT Long Term Goals - 01/04/16 1704    OT LONG TERM GOAL #1   Title Patient will improve LUE strength by 1 mm grade to demonstrate lifting of pot filled with water to be independent with cooking.   Baseline unable to lift pots at evaluation especially if filled.    Time 8   Period Weeks    Status Partially Met   OT LONG TERM GOAL #2   Title Patient will improve LUE grip and strength sufficient to carry full laundry basket of clothes safely to laundry room using BUE.     Baseline unable at evaluation.   Time 8   Period Weeks   Status Partially Met   OT LONG TERM GOAL #3   Title Patient will be able to carry light weight items up and down stairs with modified technique and good balance.    Baseline unable at evaluation   Time 8   Period Weeks   Status Not Met   OT LONG TERM GOAL #4   Title Patient will be able to stand up to 20 minutes to wash dishes and/or fold laundry.     Baseline has to sit to complete laundry folding.   Time 8   Period Weeks   Status Partially Met   OT LONG TERM GOAL #5   Title Patient will demonstrate typing of 3 paragraphs in 15 minutes with less than 5 errors.    Baseline slow to type   Time 8   Status Partially Met   OT LONG TERM GOAL #6   Title Patient will increase grip strength on L hand by 3 pounds to be able to open jars and containers.    Baseline right grip 66#, left 39# at eval   Time 8   Period Weeks   Status Partially Met   OT LONG TERM GOAL #7   Title Pt. will improve Left UE control to be able to independently pick up items off the floor quickly.   Baseline Pt. has difficulty.   Time 8   Period Weeks   Status Partially Met   OT LONG TERM GOAL #8   Title Pt. will independently perform hair care while sustaining left UE in elevation the full duration without rest breaks.   Baseline Pt. left UE fatigues   Period Weeks   Status Partially Met   OT LONG TERM GOAL  #10   TITLE Pt. will improve typing speed with typing a 6 sentence paragraph by 1 min.   Baseline 5 min. & 35 sec.   1 sentence:1 min. & 24 sec.   Period Weeks   Status Partially Met               Plan - 01/10/16 1659    Rehab Potential Excellent   OT Frequency 2x / week   OT Duration 8 weeks   OT Treatment/Interventions Self-care/ADL  training;Therapeutic exercise;Functional Mobility Training;Patient/family education;Neuromuscular education;Manual Therapy;Balance training;Therapeutic exercises;DME and/or AE instruction;Therapeutic activities   Consulted and Agree with Plan of Care Patient  Patient will benefit from skilled therapeutic intervention in order to improve the following deficits and impairments:  Decreased endurance, Decreased activity tolerance, Decreased knowledge of use of DME, Decreased strength, Decreased balance, Decreased mobility, Pain, Decreased coordination, Impaired UE functional use  Visit Diagnosis: Other lack of coordination    Problem List Patient Active Problem List   Diagnosis Date Noted  . 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 Hypertensive urgency 09/27/2015  . TIA (transient ischemic attack) 09/27/2015   Harrel Carina, MS, OTR/L   Harrel Carina 01/10/2016, 5:14 PM  [place] MAIN [facility] SERVICES [address] [place], [place], [address] Phone: [phone]   Fax:  [phone]  Name: Thandiwe Siragusa MRN: [id number] Date of Birth: Sep 05, 1970

## 2016-01-12 ENCOUNTER — Ambulatory Visit: Payer: BC Managed Care – PPO | Admitting: Occupational Therapy

## 2016-01-12 DIAGNOSIS — R278 Other lack of coordination: Secondary | ICD-10-CM | POA: Diagnosis not present

## 2016-01-12 NOTE — Patient Instructions (Signed)
OT TREATMENT    Neuro muscular re-education: Pt. worked on grasping objects with the left hand to simulate picking up objects and coins from the floor, and reaching to pick up items from the backseat of the car. Pt. Demonstrated good left hand FMC and manipulation of objects in different contexts, and positions from tabletop tasks.    Therapeutic Exercise: Pt. performed 4# dowel ex. For UE strengthening secondary to weakness. Bilateral shoulder flexion, chest press, circular patterns, and elbow flexion/extension were performed. 3# dumbbell ex. for elbow flexion and extension,  3# for forearm supination/pronation, wrist flexion/extension, and 2# for radial deviation. Pt. requires rest breaks and verbal cues for proper technique. 1 set 20 reps each.

## 2016-01-12 NOTE — Therapy (Signed)
[place] MAIN [facility] SERVICES [address] [place], [place], [address] Phone: [phone]   Fax:  [phone]  Occupational Therapy Treatment/Discharge Summary  Patient Details  Name: Stephanie Hawkins MRN: [id number] Date of Birth: July 01, 1971 Referring Provider: Manuella Ghazi  Encounter Date: 01/12/2016      OT End of Session - 01/12/16 1705    Visit Number 23   Number of Visits 36   Date for OT Re-Evaluation 02/07/16   Authorization Type Last Progress Report 01/04/2016   OT Start Time 1615   OT Stop Time 1653   OT Time Calculation (min) 38 min   Activity Tolerance Patient tolerated treatment well   Behavior During Therapy [facility] for tasks assessed/performed      Past Medical History  Diagnosis Date  . Hypertension   . Stroke [facility])     Past Surgical History  Procedure Laterality Date  . Tubal ligation    . Tee without cardioversion N/A 09/29/2015    Procedure: Transesophageal Echocardiogram (Tee);  Surgeon: Dionisio David, MD;  Location: [facility] ORS;  Service: Cardiovascular;  Laterality: N/A;    There were no vitals filed for this visit.      Subjective Assessment - 01/12/16 1622    Subjective  Pt. is planning to have Thyroid surgery on 01/19/2016   Pertinent History Stephanie Hawkins is a 45 y.o. female with a known history of hypertension admitted to [facility] on 09/27/2015 with left lower extremity weakness. Patient woke up that morning was trying to go to the bathroom to brush her teeth. She felt weak in her left leg and also noticed some weakness in the left upper extremity. Patient says her left leg was weaker than the left upper extremity when she first noticed it. No history of any slurred speech. Patient had not been taking blood pressure medication for the last 1 year. Has not followed up with any primary care physician for the last few years. When patient presented to the emergency room her blood pressure was high and systolic blood pressure was more  than 210 mm of Hg and diastolic pressure was around 160 mm of Hg. Initial CT scan of the head did not show any intracranial abnormality. No history of any headache dizziness or blurry vision. No history of any fall or any seizures. Pt reports full independence with community mobility prior to arrival. 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 Brain MRI showed small acute lacunar CVA R posterior corona radiata to R putamen as well as chronic L CVA. Since discharge she has not been able to perform higher level IADL tasks such as laundry, cooking, cleaning.  She attempted to perform laundry and had to sit to fold clothes. Her church family has been bringing in meals.    Patient Stated Goals Patient wants to be independent with all tasks.    Currently in Pain? No/denies   Pain Score 0-No pain            [facility] OT Assessment - 01/12/16 1659    Coordination   Left 9 Hole Peg Test 27 sec.   Strength   Overall Strength Comments LUE strength 4+/5 shoulder flexion, abduction, 5/5 elbow flexion/extension, forearm supination/pronation, wrist flexion/extension   Hand Function   Left Hand Grip (lbs) 64 lbs.   Left Hand Lateral Pinch 18 lbs   Left 3 point pinch 15 lbs      OT TREATMENT  Neuro muscular re-education: Pt. worked on grasping objects with the left hand to simulate picking up objects and coins from the floor, and reaching to pick up items from the backseat of the car. Pt. Demonstrated good left hand [place] and manipulation of objects in different contexts, and positions from tabletop tasks.    Therapeutic Exercise: Pt. performed 4# dowel ex. For UE strengthening secondary to weakness. Bilateral shoulder flexion, chest press, circular patterns, and elbow flexion/extension were performed. 3# dumbbell ex. for elbow flexion and extension,  3# for forearm supination/pronation, wrist flexion/extension, and 2# for radial deviation. Pt. requires  rest breaks and verbal cues for proper technique. 1 set 20 reps each.                        OT Education - 01/12/16 1704    Education provided Yes   Person(s) Educated Patient   Methods Explanation   Comprehension Verbalized understanding             OT Long Term Goals - 01/12/16 1724    OT LONG TERM GOAL #1   Title Patient will improve LUE strength by 1 mm grade to demonstrate lifting of pot filled with water to be independent with cooking.   Baseline unable to lift pots at evaluation especially if filled.    Time 8   Period Weeks   Status Partially Met   OT LONG TERM GOAL #2   Title Patient will improve LUE grip and strength sufficient to carry full laundry basket of clothes safely to laundry room using BUE.     Baseline unable at evaluation.   Period Weeks   Status Achieved   OT LONG TERM GOAL #3   Title Patient will be able to carry light weight items up and down stairs with modified technique and good balance.    Baseline unable at evaluation   Time 8   Period Weeks   Status Partially Met   OT LONG TERM GOAL #4   Title Patient will be able to stand up to 20 minutes to wash dishes and/or fold laundry.     Baseline has to sit to complete laundry folding.   Time 8   Period Weeks   Status Partially Met   OT LONG TERM GOAL #5   Title Patient will demonstrate typing of 3 paragraphs in 15 minutes with less than 5 errors.    Baseline slow to type   Period Weeks   Status Partially Met   OT LONG TERM GOAL #6   Title Patient will increase grip strength on L hand by 3 pounds to be able to open jars and containers.    Baseline right grip 66#, left 39# at eval   Time 8   Period Weeks   Status Achieved   OT LONG TERM GOAL #7   Title Pt. will improve Left UE control to be able to independently pick up items off the floor quickly.   Baseline Pt. has difficulty.   Time 8   Period Weeks   Status Achieved   OT LONG TERM GOAL #8   Title Pt. will  independently perform hair care while sustaining left UE in elevation the full duration without rest breaks.   Baseline Pt. left UE fatigues   Time 8   Status Achieved   OT LONG TERM GOAL  #10   TITLE Pt. will improve typing speed with typing a 6 sentence paragraph by 1 min.   Baseline  5 min. & 35 sec.   1 sentence:1 min. & 24 sec.   Time 8   Period Weeks   Status Partially Met               Plan - 01/12/16 1705    Clinical Impression Statement Pt. has made excellent progress with left UE and hand  strength and [place]. Pt. is now ready for discharge from OT services.   OT Frequency 2x / week   OT Duration 8 weeks   OT Treatment/Interventions Self-care/ADL training;Therapeutic exercise;Functional Mobility Training;Patient/family education;Neuromuscular education;Manual Therapy;Balance training;Therapeutic exercises;DME and/or AE instruction;Therapeutic activities   Consulted and Agree with Plan of Care Patient      Patient will benefit from skilled therapeutic intervention in order to improve the following deficits and impairments:  Decreased endurance, Decreased activity tolerance, Decreased knowledge of use of DME, Decreased strength, Decreased balance, Decreased mobility, Pain, Decreased coordination, Impaired UE functional use  Visit Diagnosis: Other lack of coordination    Problem List Patient Active Problem List   Diagnosis Date Noted  . 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 Hypertensive urgency 09/27/2015  . TIA (transient ischemic attack) 09/27/2015   Harrel Carina, MS, OTR/L  Harrel Carina 01/12/2016, 5:26 PM  [place] MAIN [facility] SERVICES [address] [place], [place], [address] Phone: [phone]   Fax:  [phone]  Name: Stephanie Hawkins MRN: [id number] Date of Birth: 12-03-1970

## 2016-01-17 ENCOUNTER — Encounter: Payer: BC Managed Care – PPO | Admitting: Occupational Therapy

## 2016-01-19 ENCOUNTER — Encounter: Payer: BC Managed Care – PPO | Admitting: Occupational Therapy

## 2016-01-24 ENCOUNTER — Encounter: Payer: BC Managed Care – PPO | Admitting: Occupational Therapy

## 2016-01-26 ENCOUNTER — Encounter: Payer: BC Managed Care – PPO | Admitting: Occupational Therapy

## 2016-01-31 ENCOUNTER — Encounter: Payer: BC Managed Care – PPO | Admitting: Occupational Therapy

## 2016-02-02 ENCOUNTER — Encounter: Payer: BC Managed Care – PPO | Admitting: Occupational Therapy

## 2016-02-21 ENCOUNTER — Other Ambulatory Visit (HOSPITAL_COMMUNITY): Payer: Self-pay | Admitting: Interventional Radiology

## 2016-02-21 DIAGNOSIS — I639 Cerebral infarction, unspecified: Secondary | ICD-10-CM

## 2016-02-29 ENCOUNTER — Telehealth (HOSPITAL_COMMUNITY): Payer: Self-pay | Admitting: Interventional Radiology

## 2016-02-29 NOTE — Telephone Encounter (Signed)
Called pt, left VM for her to call back to reschedule MRI - machine down. JM

## 2016-03-01 ENCOUNTER — Ambulatory Visit (HOSPITAL_COMMUNITY): Admission: RE | Admit: 2016-03-01 | Payer: BC Managed Care – PPO | Source: Ambulatory Visit

## 2016-03-01 ENCOUNTER — Ambulatory Visit (HOSPITAL_COMMUNITY): Payer: BC Managed Care – PPO

## 2016-03-13 ENCOUNTER — Ambulatory Visit (HOSPITAL_COMMUNITY)
Admission: RE | Admit: 2016-03-13 | Discharge: 2016-03-13 | Disposition: A | Discharge status: Home / Self Care | Payer: BC Managed Care – PPO | Source: Ambulatory Visit | Attending: Interventional Radiology | Admitting: Interventional Radiology

## 2016-03-13 DIAGNOSIS — Z8673 Personal history of transient ischemic attack (TIA), and cerebral infarction without residual deficits: Secondary | ICD-10-CM | POA: Diagnosis present

## 2016-03-13 DIAGNOSIS — I671 Cerebral aneurysm, nonruptured: Secondary | ICD-10-CM | POA: Insufficient documentation

## 2016-03-13 DIAGNOSIS — I6602 Occlusion and stenosis of left middle cerebral artery: Secondary | ICD-10-CM | POA: Insufficient documentation

## 2016-03-13 DIAGNOSIS — I6521 Occlusion and stenosis of right carotid artery: Secondary | ICD-10-CM | POA: Insufficient documentation

## 2016-03-13 DIAGNOSIS — I6611 Occlusion and stenosis of right anterior cerebral artery: Secondary | ICD-10-CM | POA: Insufficient documentation

## 2016-03-13 DIAGNOSIS — I639 Cerebral infarction, unspecified: Secondary | ICD-10-CM

## 2016-03-13 LAB — CREATININE, SERUM
Creatinine, Ser: 1.12 mg/dL — ABNORMAL HIGH (ref 0.44–1.00)
GFR, EST NON AFRICAN AMERICAN: 58 mL/min — AB (ref >60)

## 2016-03-13 MED ORDER — GADOBENATE DIMEGLUMINE 529 MG/ML IV SOLN
20.0000 mL | Freq: Once | INTRAVENOUS | Status: AC | PRN
  Administered 2016-03-13: 20 mL via INTRAVENOUS

## 2016-04-12 ENCOUNTER — Telehealth (HOSPITAL_COMMUNITY): Payer: Self-pay

## 2016-04-12 NOTE — Telephone Encounter (Signed)
Called to see if pt was symptomatic, left message for pt to call back. AW

## 2016-05-03 ENCOUNTER — Telehealth (HOSPITAL_COMMUNITY): Payer: Self-pay

## 2016-05-03 NOTE — Telephone Encounter (Signed)
Left message for pt to return call. AW 

## 2016-05-15 ENCOUNTER — Telehealth (HOSPITAL_COMMUNITY): Payer: Self-pay

## 2016-05-15 NOTE — Telephone Encounter (Signed)
Pt agreed to f/u in 6 months with angiogram. Can't do feb 19-23. Can possibly schedule the week before.

## 2016-09-17 ENCOUNTER — Other Ambulatory Visit (HOSPITAL_COMMUNITY): Payer: Self-pay | Admitting: Interventional Radiology

## 2016-09-17 DIAGNOSIS — I639 Cerebral infarction, unspecified: Secondary | ICD-10-CM

## 2016-09-28 ENCOUNTER — Other Ambulatory Visit: Payer: Self-pay | Admitting: Radiology

## 2016-10-01 ENCOUNTER — Other Ambulatory Visit: Payer: Self-pay | Admitting: Radiology

## 2016-10-02 ENCOUNTER — Encounter (HOSPITAL_COMMUNITY): Payer: Self-pay

## 2016-10-02 ENCOUNTER — Other Ambulatory Visit (HOSPITAL_COMMUNITY): Payer: Self-pay | Admitting: Interventional Radiology

## 2016-10-02 ENCOUNTER — Ambulatory Visit (HOSPITAL_COMMUNITY)
Admission: RE | Admit: 2016-10-02 | Discharge: 2016-10-02 | Disposition: A | Discharge status: Home / Self Care | Payer: BC Managed Care – PPO | Source: Ambulatory Visit | Attending: Interventional Radiology | Admitting: Interventional Radiology

## 2016-10-02 DIAGNOSIS — I6602 Occlusion and stenosis of left middle cerebral artery: Secondary | ICD-10-CM | POA: Diagnosis not present

## 2016-10-02 DIAGNOSIS — Z8249 Family history of ischemic heart disease and other diseases of the circulatory system: Secondary | ICD-10-CM | POA: Diagnosis not present

## 2016-10-02 DIAGNOSIS — Z823 Family history of stroke: Secondary | ICD-10-CM | POA: Diagnosis not present

## 2016-10-02 DIAGNOSIS — I639 Cerebral infarction, unspecified: Secondary | ICD-10-CM

## 2016-10-02 DIAGNOSIS — I671 Cerebral aneurysm, nonruptured: Secondary | ICD-10-CM | POA: Diagnosis not present

## 2016-10-02 DIAGNOSIS — I6521 Occlusion and stenosis of right carotid artery: Secondary | ICD-10-CM | POA: Diagnosis present

## 2016-10-02 DIAGNOSIS — I10 Essential (primary) hypertension: Secondary | ICD-10-CM | POA: Diagnosis not present

## 2016-10-02 DIAGNOSIS — I6613 Occlusion and stenosis of bilateral anterior cerebral arteries: Secondary | ICD-10-CM | POA: Diagnosis not present

## 2016-10-02 DIAGNOSIS — Z8673 Personal history of transient ischemic attack (TIA), and cerebral infarction without residual deficits: Secondary | ICD-10-CM | POA: Diagnosis not present

## 2016-10-02 DIAGNOSIS — Z7982 Long term (current) use of aspirin: Secondary | ICD-10-CM | POA: Insufficient documentation

## 2016-10-02 HISTORY — PX: IR GENERIC HISTORICAL: IMG1180011

## 2016-10-02 LAB — CBC
HCT: 32.4 % — ABNORMAL LOW (ref 36.0–46.0)
Hemoglobin: 10.3 g/dL — ABNORMAL LOW (ref 12.0–15.0)
MCH: 27.8 pg (ref 26.0–34.0)
MCHC: 31.8 g/dL (ref 30.0–36.0)
MCV: 87.6 fL (ref 78.0–100.0)
Platelets: 213 K/uL (ref 150–400)
RBC: 3.7 MIL/uL — ABNORMAL LOW (ref 3.87–5.11)
RDW: 12.8 % (ref 11.5–15.5)
WBC: 5.5 K/uL (ref 4.0–10.5)

## 2016-10-02 LAB — BASIC METABOLIC PANEL WITH GFR
Anion gap: 10 (ref 5–15)
BUN: 14 mg/dL (ref 6–20)
CO2: 27 mmol/L (ref 22–32)
Calcium: 9.4 mg/dL (ref 8.9–10.3)
Chloride: 103 mmol/L (ref 101–111)
Creatinine, Ser: 1.11 mg/dL — ABNORMAL HIGH (ref 0.44–1.00)
GFR calc Af Amer: >60 mL/min
GFR calc non Af Amer: 59 mL/min — ABNORMAL LOW
Glucose, Bld: 101 mg/dL — ABNORMAL HIGH (ref 65–99)
Potassium: 3.7 mmol/L (ref 3.5–5.1)
Sodium: 140 mmol/L (ref 135–145)

## 2016-10-02 LAB — APTT: aPTT: 32 s (ref 24–36)

## 2016-10-02 LAB — PROTIME-INR
INR: 1.03
PROTHROMBIN TIME: 13.5 s (ref 11.4–15.2)

## 2016-10-02 MED ORDER — LIDOCAINE HCL (PF) 1 % IJ SOLN
INTRAMUSCULAR | Status: AC | PRN
  Administered 2016-10-02: 10 mL

## 2016-10-02 MED ORDER — LIDOCAINE HCL 1 % IJ SOLN
INTRAMUSCULAR | Status: AC
  Filled 2016-10-02: qty 20

## 2016-10-02 MED ORDER — SODIUM CHLORIDE 0.9 % IV BOLUS (SEPSIS)
INTRAVENOUS | Status: AC | PRN
  Administered 2016-10-02: 250 mL via INTRAVENOUS

## 2016-10-02 MED ORDER — FENTANYL CITRATE (PF) 100 MCG/2ML IJ SOLN
INTRAMUSCULAR | Status: AC
  Filled 2016-10-02: qty 2

## 2016-10-02 MED ORDER — SODIUM CHLORIDE 0.9 % IV SOLN
INTRAVENOUS | Status: AC | PRN
  Administered 2016-10-02: 125 mL/h via INTRAVENOUS

## 2016-10-02 MED ORDER — HEPARIN SODIUM (PORCINE) 1000 UNIT/ML IJ SOLN
INTRAMUSCULAR | Status: AC
  Filled 2016-10-02: qty 1

## 2016-10-02 MED ORDER — HEPARIN SODIUM (PORCINE) 1000 UNIT/ML IJ SOLN
INTRAMUSCULAR | Status: AC | PRN
  Administered 2016-10-02: 1000 [IU] via INTRAVENOUS

## 2016-10-02 MED ORDER — SODIUM CHLORIDE 0.9 % IV SOLN: INTRAVENOUS | Status: AC

## 2016-10-02 MED ORDER — SODIUM CHLORIDE 0.9 % IV SOLN: Freq: Once | INTRAVENOUS | Status: DC

## 2016-10-02 MED ORDER — IOPAMIDOL (ISOVUE-300) INJECTION 61%
INTRAVENOUS | Status: AC
  Administered 2016-10-02: 85 mL
  Filled 2016-10-02: qty 150

## 2016-10-02 MED ORDER — FENTANYL CITRATE (PF) 100 MCG/2ML IJ SOLN
INTRAMUSCULAR | Status: AC | PRN
  Administered 2016-10-02: 25 ug via INTRAVENOUS

## 2016-10-02 MED ORDER — MIDAZOLAM HCL 2 MG/2ML IJ SOLN
INTRAMUSCULAR | Status: AC
  Filled 2016-10-02: qty 2

## 2016-10-02 MED ORDER — MIDAZOLAM HCL 2 MG/2ML IJ SOLN
INTRAMUSCULAR | Status: AC | PRN
  Administered 2016-10-02: 1 mg via INTRAVENOUS

## 2016-10-02 NOTE — Sedation Documentation (Signed)
5 Fr sheath removed from R fem art by Fayrene FearingJames, RT. Hemostasis achieved using Exoseal closure device. R groin level 0, 3+RDP.

## 2016-10-02 NOTE — Sedation Documentation (Signed)
removed at the request of MD. 

## 2016-10-02 NOTE — H&P (Signed)
Chief Complaint: Patient was seen in consultation today for cerebral arteriogram at the request of Dr Thana Farr  Referring Physician(s): Dr Adline Peals  Supervising Physician: Julieanne Cotton  Patient Status: [facility] - Out-pt  History of Present Illness: Stephanie Hawkins is a 46 y.o. female   CVA 09/27/15 Work up with arteriogram 10/04/2015 IMPRESSION: Approximately 70-75% stenosis of the right internal carotid artery supraclinoid segment. 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 Angiographically occluded right anterior cerebral artery just distal to its origin. Approximately 50% narrowing of the left vertebrobasilar junction. Focal areas of mild caliber irregularity and narrowing in the anterior circulation left more than right though non specific, suggestive of intracranial arteriosclerosis, than vasculitis.  Follow up MRI/MRA 03/13/2016: IMPRESSION: 1. No acute intracranial abnormality. Remote lacunar infarcts of the bilateral corona radiata. 2. Severe stenosis of the supraclinoid segment of the right internal carotid artery, as demonstrated on prior angiogram. 3. Small, 3 x 2 mm aneurysm projecting posteriorly and inferiorly from the expected location of the right PCOM origin, unchanged from prior angiogram. 4. Unchanged occlusion of the right A1 segment anterior cerebral artery. 5. Mild narrowing of the left MCA M1 segment, unchanged.  Now scheduled for 1 year follow up cerebral arteriogram Pt has no complaints Possible minimal left sided weakness---pt says really "is nothing"   Past Medical History:  Diagnosis Date  . Hypertension   . Stroke [facility])     Past Surgical History:  Procedure Laterality Date  . TEE WITHOUT CARDIOVERSION N/A 09/29/2015   Procedure: Transesophageal Echocardiogram (Tee);  Surgeon: Laurier Nancy, MD;  Location: [facility] ORS;  Service: Cardiovascular;  Laterality: N/A;  .  TUBAL LIGATION      Allergies: Patient has no known allergies.  Medications: Prior to Admission medications   Medication Sig Start Date End Date Taking? Authorizing Provider  amLODipine (NORVASC) 10 MG tablet Take 1 tablet (10 mg total) by mouth daily. 09/29/15  Yes Altamese Dilling, MD  aspirin EC 325 MG tablet Take 1 tablet (325 mg total) by mouth daily. 09/29/15  Yes Altamese Dilling, MD  atorvastatin (LIPITOR) 40 MG tablet Take 1 tablet (40 mg total) by mouth daily at 6 PM. 09/29/15  Yes Altamese Dilling, MD  b complex vitamins tablet Take 1 tablet by mouth daily.   Yes Historical Provider, MD  cholecalciferol (VITAMIN D) 1000 units tablet Take 1,000 Units by mouth daily.   Yes Historical Provider, MD  cloNIDine (CATAPRES) 0.1 MG tablet Take 1 tablet (0.1 mg total) by mouth 3 (three) times daily. 09/29/15  Yes Altamese Dilling, MD  lisinopril-hydrochlorothiazide (PRINZIDE,ZESTORETIC) 20-12.5 MG tablet Take 1 tablet by mouth daily.   Yes Historical Provider, MD  metoprolol (LOPRESSOR) 50 MG tablet Take 1 tablet (50 mg total) by mouth 2 (two) times daily. 09/29/15  Yes Altamese Dilling, MD  Multiple Vitamins-Minerals (MULTIVITAMIN WITH MINERALS) tablet Take 1 tablet by mouth daily.   Yes Historical Provider, MD  vitamin E (VITAMIN E) 400 UNIT capsule Take 400 Units by mouth daily.   Yes Historical Provider, MD     Family History  Problem Relation Age of Onset  . Heart failure Mother   . CVA Father     Social History   Social History  . Marital status: Married    Spouse name: N/A  . Number of children: N/A  . Years of education: N/A   Occupational History  . works in [employer] school system    Social History  Main Topics  . Smoking status: Never Smoker  . Smokeless tobacco: None  . Alcohol use No  . Drug use: No  . Sexual activity: Not Asked   Other Topics Concern  . None   Social History Narrative   Lives with family    Review of Systems: A 12  point ROS discussed and pertinent positives are indicated in the HPI above.  All other systems are negative.  Review of Systems  Constitutional: Negative for activity change, fatigue and fever.  HENT: Negative for tinnitus, trouble swallowing and voice change.   Eyes: Negative for visual disturbance.  Respiratory: Negative for cough and shortness of breath.   Cardiovascular: Negative for chest pain.  Gastrointestinal: Negative for abdominal pain.  Musculoskeletal: Negative for back pain.  Neurological: Negative for dizziness, tremors, seizures, syncope, facial asymmetry, speech difficulty, weakness, light-headedness, numbness and headaches.  Psychiatric/Behavioral: Negative for behavioral problems and confusion.    Vital Signs: BP (!) 154/93   Pulse (!) 49   Temp 98 F (36.7 C) (Oral)   Resp 18   Ht 5\' 7"  (1.702 m)   Wt 220 lb (99.8 kg)   LMP 09/04/2016 Comment: tubal ligation  SpO2 100%   BMI 34.46 kg/m   Physical Exam  Constitutional: She is oriented to person, place, and time. She appears well-nourished.  HENT:  Head: Atraumatic.  Eyes: EOM are normal.  Neck: Neck supple.  Cardiovascular: Normal rate, regular rhythm and normal heart sounds.   Pulmonary/Chest: Effort normal and breath sounds normal.  Abdominal: Soft. Bowel sounds are normal.  Musculoskeletal: Normal range of motion. She exhibits no edema.  Neurological: She is alert and oriented to person, place, and time.  Skin: Skin is warm and dry.  Psychiatric: She has a normal mood and affect. Her behavior is normal. Judgment and thought content normal.  Nursing note and vitals reviewed.   Mallampati Score:  MD Evaluation Airway: WNL Heart: WNL Abdomen: WNL Chest/ Lungs: WNL ASA  Classification: 2 Mallampati/Airway Score: Two  Imaging: No results found.  Labs:  CBC:  Recent Labs  10/04/15 0754  WBC 4.7  HGB 10.3*  HCT 30.7*  PLT 166    COAGS:  Recent Labs  10/04/15 0754  INR 1.06  APTT  31    BMP:  Recent Labs  10/04/15 0754 03/13/16 1515  NA 139  --   K 4.1  --   CL 105  --   CO2 28  --   GLUCOSE 99  --   BUN 19  --   CALCIUM 9.0  --   CREATININE 0.96 1.12*  GFRNONAA >60 58*  GFRAA >60 >60    LIVER FUNCTION TESTS: No results for input(s): BILITOT, AST, ALT, ALKPHOS, PROT, ALBUMIN in the last 8760 hours.  TUMOR MARKERS: No results for input(s): AFPTM, CEA, CA199, CHROMGRNA in the last 8760 hours.  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 All of the patient's questions were answered, patient is agreeable to proceed. Consent signed and in chart.  Thank you for this interesting consult.  I greatly enjoyed meeting Theresia BoughLakecia Denny and look forward to participating in their care.  A copy of this report was sent to the requesting provider on this date.  Electronically Signed: Robet LeuURPIN,Larkin Morelos A 10/02/2016,  7:34 AM   I spent a total of  30 Minutes   in face to face in clinical consultation, greater than 50% of which was counseling/coordinating care for cerebral arteriogram

## 2016-10-02 NOTE — Sedation Documentation (Signed)
Gauze/Tegaderm bandage applied to R fem artery puncture.  Groin level 0, 3+RDP, drsg CDI.

## 2016-10-02 NOTE — Procedures (Signed)
S/P 4 vessel cerebral arteriogram. RT CFA approach. Findings. 1,RT PCOM aneurysm 4mm x 3.3 mm  2.RT ICA supraclinoid  seg 65 to 70 % stenosis . 3limited PCOM 2.689mm x 2.9 mm aneurysm

## 2016-10-02 NOTE — Discharge Instructions (Signed)
Cerebral Angiogram, Care After Refer to this sheet in the next few weeks. These instructions provide you with information on caring for yourself after your procedure. Your health care provider may also give you more specific instructions. Your treatment has been planned according to current medical practices, but problems sometimes occur. Call your health care provider if you have any problems or questions after your procedure. What can I expect after the procedure? After your procedure, it is typical to have the following:  Bruising at the catheter insertion site that usually fades within 1-2 weeks.  Blood collecting in the tissue (hematoma) that may be painful to the touch. It should usually decrease in size and tenderness within 1-2 weeks.  A mild headache. Follow these instructions at home:  Take medicines only as directed by your health care provider.  You may shower 24-48 hours after the procedure or as directed by your health care provider. Remove the bandage (dressing) and gently wash the site with plain soap and water. Pat the area dry with a clean towel. Do not rub the site, because this may cause bleeding.  Do not take baths, swim, or use a hot tub until your health care provider approves.  Check your insertion site every day for redness, swelling, or drainage.  Do not apply powder or lotion to the site.  Do not lift over 10 lb (4.5 kg) for 5 days after your procedure or as directed by your health care provider.  Ask your health care provider when it is okay to:  Return to work or school.  Resume usual physical activities or sports.  Resume sexual activity.  Do not drive home if you are discharged the same day as the procedure. Have someone else drive you.  You may drive 24 hours after the procedure unless otherwise instructed by your health care provider.  Do not operate machinery or power tools for 24 hours after the procedure or as directed by your health care  provider.  If your procedure was done as an outpatient procedure, which means that you went home the same day as your procedure, a responsible adult should be with you for the first 24 hours after you arrive home.  Keep all follow-up visits as directed by your health care provider. This is important. Contact a health care provider if:  You have a fever.  You have chills.  You have increased bleeding from the catheter insertion site. Hold pressure on the site. Get help right away if:  You have vision changes or loss of vision.  You have numbness or weakness on one side of your body.  You have difficulty talking, or you have slurred speech or cannot speak (aphasia).  You feel confused or have difficulty remembering.  You have unusual pain at the catheter insertion site.  You have redness, warmth, or swelling at the catheter insertion site.  You have drainage (other than a small amount of blood on the dressing) from the catheter insertion site.  The catheter insertion site is bleeding, and the bleeding does not stop after 30 minutes of holding steady pressure on the site. These symptoms may represent a serious problem that is an emergency. Do not wait to see if the symptoms will go away. Get medical help right away. Call your local emergency services (911 in [place] Do not drive yourself to the hospital.  This information is not intended to replace advice given to you by your health care provider. Make sure you discuss any questions  you have with your health care provider. Document Released: 01/04/2014 Document Revised: 01/26/2016 Document Reviewed: 09/02/2013 Elsevier Interactive Patient Education  2017 [employer] Femoral Site Care Introduction Refer to this sheet in the next few weeks. These instructions provide you with information about caring for yourself after your procedure. Your health care provider may also give you more specific instructions. Your treatment has been  planned according to current medical practices, but problems sometimes occur. Call your health care provider if you have any problems or questions after your procedure. What can I expect after the procedure? After your procedure, it is typical to have the following:  Bruising at the site that usually fades within 1-2 weeks.  Blood collecting in the tissue (hematoma) that may be painful to the touch. It should usually decrease in size and tenderness within 1-2 weeks. Follow these instructions at home:  Take medicines only as directed by your health care provider.  You may shower 24-48 hours after the procedure or as directed by your health care provider. Remove the bandage (dressing) and gently wash the site with plain soap and water. Pat the area dry with a clean towel. Do not rub the site, because this may cause bleeding.  Do not take baths, swim, or use a hot tub until your health care provider approves.  Check your insertion site every day for redness, swelling, or drainage.  Do not apply powder or lotion to the site.  Limit use of stairs to twice a day for the first 2-3 days or as directed by your health care provider.  Do not squat for the first 2-3 days or as directed by your health care provider.  Do not lift over 10 lb (4.5 kg) for 5 days after your procedure or as directed by your health care provider.  Ask your health care provider when it is okay to:  Return to work or school.  Resume usual physical activities or sports.  Resume sexual activity.  Do not drive home if you are discharged the same day as the procedure. Have someone else drive you.  You may drive 24 hours after the procedure unless otherwise instructed by your health care provider.  Do not operate machinery or power tools for 24 hours after the procedure or as directed by your health care provider.  If your procedure was done as an outpatient procedure, which means that you went home the same day as your  procedure, a responsible adult should be with you for the first 24 hours after you arrive home.  Keep all follow-up visits as directed by your health care provider. This is important. Contact a health care provider if:  You have a fever.  You have chills.  You have increased bleeding from the site. Hold pressure on the site. Get help right away if:  You have unusual pain at the site.  You have redness, warmth, or swelling at the site.  You have drainage (other than a small amount of blood on the dressing) from the site.  The site is bleeding, and the bleeding does not stop after 30 minutes of holding steady pressure on the site.  Your leg or foot becomes pale, cool, tingly, or numb. This information is not intended to replace advice given to you by your health care provider. Make sure you discuss any questions you have with your health care provider. Document Released: 04/23/2014 Document Revised: 01/26/2016 Document Reviewed: 03/09/2014  2017 Elsevier Moderate Conscious Sedation, Adult Sedation is the use  of medicines to promote relaxation and relieve discomfort and anxiety. Moderate conscious sedation is a type of sedation. Under moderate conscious sedation, you are less alert than normal, but you are still able to respond to instructions, touch, or both. Moderate conscious sedation is used during short medical and dental procedures. It is milder than deep sedation, which is a type of sedation under which you cannot be easily woken up. It is also milder than general anesthesia, which is the use of medicines to make you unconscious. Moderate conscious sedation allows you to return to your regular activities sooner. Tell a health care provider about:  Any allergies you have.  All medicines you are taking, including vitamins, herbs, eye drops, creams, and over-the-counter medicines.  Use of steroids (by mouth or creams).  Any problems you or family members have had with sedatives  and anesthetic medicines.  Any blood disorders you have.  Any surgeries you have had.  Any medical conditions you have, such as sleep apnea.  Whether you are pregnant or may be pregnant.  Any use of cigarettes, alcohol, marijuana, or street drugs. What are the risks? Generally, this is a safe procedure. However, problems may occur, including:  Getting too much medicine (oversedation).  Nausea.  Allergic reaction to medicines.  Trouble breathing. If this happens, a breathing tube may be used to help with breathing. It will be removed when you are awake and breathing on your own.  Heart trouble.  Lung trouble. What happens before the procedure? Staying hydrated  Follow instructions from your health care provider about hydration, which may include:  Up to 2 hours before the procedure - you may continue to drink clear liquids, such as water, clear fruit juice, black coffee, and plain tea. Eating and drinking restrictions  Follow instructions from your health care provider about eating and drinking, which may include:  8 hours before the procedure - stop eating heavy meals or foods such as meat, fried foods, or fatty foods.  6 hours before the procedure - stop eating light meals or foods, such as toast or cereal.  6 hours before the procedure - stop drinking milk or drinks that contain milk.  2 hours before the procedure - stop drinking clear liquids. Medicine  Ask your health care provider about:  Changing or stopping your regular medicines. This is especially important if you are taking diabetes medicines or blood thinners.  Taking medicines such as aspirin and ibuprofen. These medicines can thin your blood. Do not take these medicines before your procedure if your health care provider instructs you not to. Tests and exams  You will have a physical exam.  You may have blood tests done to show:  How well your kidneys and liver are working.  How well your blood can  clot. General instructions  Plan to have someone take you home from the hospital or clinic.  If you will be going home right after the procedure, plan to have someone with you for 24 hours. What happens during the procedure?  An IV tube will be inserted into one of your veins.  Medicine to help you relax (sedative) will be given through the IV tube.  The medical or dental procedure will be performed. What happens after the procedure?  Your blood pressure, heart rate, breathing rate, and blood oxygen level will be monitored often until the medicines you were given have worn off.  Do not drive for 24 hours. This information is not intended to replace advice given  to you by your health care provider. Make sure you discuss any questions you have with your health care provider. Document Released: 05/15/2001 Document Revised: 01/24/2016 Document Reviewed: 12/10/2015 Elsevier Interactive Patient Education  2017 [employer].

## 2016-10-03 ENCOUNTER — Encounter (HOSPITAL_COMMUNITY): Payer: Self-pay | Admitting: Interventional Radiology

## 2016-10-04 ENCOUNTER — Telehealth (HOSPITAL_COMMUNITY): Payer: Self-pay

## 2016-10-04 NOTE — Telephone Encounter (Signed)
Called to schedule consult, left message for pt to return call. AW 

## 2016-10-08 ENCOUNTER — Other Ambulatory Visit: Payer: Self-pay | Admitting: Family Medicine

## 2016-10-08 DIAGNOSIS — Z1231 Encounter for screening mammogram for malignant neoplasm of breast: Secondary | ICD-10-CM

## 2016-10-09 ENCOUNTER — Other Ambulatory Visit (HOSPITAL_COMMUNITY): Payer: Self-pay | Admitting: Interventional Radiology

## 2016-10-09 DIAGNOSIS — I729 Aneurysm of unspecified site: Secondary | ICD-10-CM

## 2016-10-23 ENCOUNTER — Encounter (HOSPITAL_COMMUNITY): Payer: Self-pay | Admitting: Radiology

## 2016-10-23 ENCOUNTER — Ambulatory Visit (HOSPITAL_COMMUNITY)
Admission: RE | Admit: 2016-10-23 | Discharge: 2016-10-23 | Disposition: A | Discharge status: Home / Self Care | Payer: BC Managed Care – PPO | Source: Ambulatory Visit | Attending: Interventional Radiology | Admitting: Interventional Radiology

## 2016-10-23 DIAGNOSIS — I729 Aneurysm of unspecified site: Secondary | ICD-10-CM

## 2016-10-23 HISTORY — PX: IR GENERIC HISTORICAL: IMG1180011

## 2016-11-01 ENCOUNTER — Encounter (HOSPITAL_COMMUNITY): Payer: Self-pay | Admitting: Interventional Radiology

## 2016-11-06 ENCOUNTER — Other Ambulatory Visit: Payer: Self-pay | Admitting: Family Medicine

## 2016-11-06 ENCOUNTER — Ambulatory Visit
Admission: RE | Admit: 2016-11-06 | Discharge: 2016-11-06 | Disposition: A | Discharge status: Home / Self Care | Payer: BC Managed Care – PPO | Source: Ambulatory Visit | Attending: Family Medicine | Admitting: Family Medicine

## 2016-11-06 DIAGNOSIS — R928 Other abnormal and inconclusive findings on diagnostic imaging of breast: Secondary | ICD-10-CM | POA: Diagnosis not present

## 2016-11-06 DIAGNOSIS — Z1231 Encounter for screening mammogram for malignant neoplasm of breast: Secondary | ICD-10-CM

## 2016-11-08 ENCOUNTER — Other Ambulatory Visit: Payer: Self-pay | Admitting: Family Medicine

## 2016-11-08 DIAGNOSIS — R928 Other abnormal and inconclusive findings on diagnostic imaging of breast: Secondary | ICD-10-CM

## 2016-11-08 DIAGNOSIS — N6320 Unspecified lump in the left breast, unspecified quadrant: Secondary | ICD-10-CM

## 2016-11-09 ENCOUNTER — Encounter (HOSPITAL_COMMUNITY): Payer: Self-pay | Admitting: Interventional Radiology

## 2016-11-15 ENCOUNTER — Ambulatory Visit
Admission: RE | Admit: 2016-11-15 | Discharge: 2016-11-15 | Disposition: A | Discharge status: Home / Self Care | Payer: BC Managed Care – PPO | Source: Ambulatory Visit | Attending: Family Medicine | Admitting: Family Medicine

## 2016-11-15 DIAGNOSIS — R928 Other abnormal and inconclusive findings on diagnostic imaging of breast: Secondary | ICD-10-CM | POA: Insufficient documentation

## 2016-11-15 DIAGNOSIS — N6320 Unspecified lump in the left breast, unspecified quadrant: Secondary | ICD-10-CM

## 2017-04-02 ENCOUNTER — Other Ambulatory Visit (HOSPITAL_COMMUNITY): Payer: Self-pay | Admitting: Interventional Radiology

## 2017-04-02 DIAGNOSIS — I771 Stricture of artery: Secondary | ICD-10-CM

## 2017-04-17 ENCOUNTER — Encounter (HOSPITAL_COMMUNITY): Payer: Self-pay

## 2017-04-17 ENCOUNTER — Ambulatory Visit (HOSPITAL_COMMUNITY)
Admission: RE | Admit: 2017-04-17 | Discharge: 2017-04-17 | Disposition: A | Discharge status: Home / Self Care | Payer: BC Managed Care – PPO | Source: Ambulatory Visit | Attending: Interventional Radiology | Admitting: Interventional Radiology

## 2017-04-17 ENCOUNTER — Ambulatory Visit (HOSPITAL_COMMUNITY): Payer: BC Managed Care – PPO

## 2017-04-17 DIAGNOSIS — Z8673 Personal history of transient ischemic attack (TIA), and cerebral infarction without residual deficits: Secondary | ICD-10-CM | POA: Diagnosis not present

## 2017-04-17 DIAGNOSIS — I771 Stricture of artery: Secondary | ICD-10-CM | POA: Diagnosis present

## 2017-04-17 DIAGNOSIS — I6521 Occlusion and stenosis of right carotid artery: Secondary | ICD-10-CM | POA: Insufficient documentation

## 2017-04-17 DIAGNOSIS — I671 Cerebral aneurysm, nonruptured: Secondary | ICD-10-CM | POA: Insufficient documentation

## 2017-04-17 LAB — CREATININE, SERUM
Creatinine, Ser: 1.15 mg/dL — ABNORMAL HIGH (ref 0.44–1.00)
GFR calc Af Amer: >60 mL/min (ref >60)
GFR calc non Af Amer: 56 mL/min — ABNORMAL LOW (ref >60)

## 2017-04-17 MED ORDER — GADOBENATE DIMEGLUMINE 529 MG/ML IV SOLN
20.0000 mL | Freq: Once | INTRAVENOUS | Status: AC
  Administered 2017-04-17: 20 mL via INTRAVENOUS

## 2017-04-23 ENCOUNTER — Telehealth (HOSPITAL_COMMUNITY): Payer: Self-pay

## 2017-04-23 NOTE — Telephone Encounter (Signed)
Left message for pt to return call. AW 

## 2017-05-09 ENCOUNTER — Telehealth (HOSPITAL_COMMUNITY): Payer: Self-pay

## 2017-05-09 NOTE — Telephone Encounter (Signed)
Left message for pt to f/u with MRI in 6 months. Will call pt to schedule when f/u is due. AW

## 2017-05-30 ENCOUNTER — Other Ambulatory Visit: Payer: Self-pay | Admitting: Family Medicine

## 2017-05-30 DIAGNOSIS — N6489 Other specified disorders of breast: Secondary | ICD-10-CM

## 2017-06-30 IMAGING — CR DG CHEST 1V PORT
1 series · 1 of 1 positions shown · non-contrast
Comparison: None.

CLINICAL DATA: Heaviness in the left lower leg. History of
hypertension.

EXAM:
PORTABLE CHEST 1 VIEW

[ap]
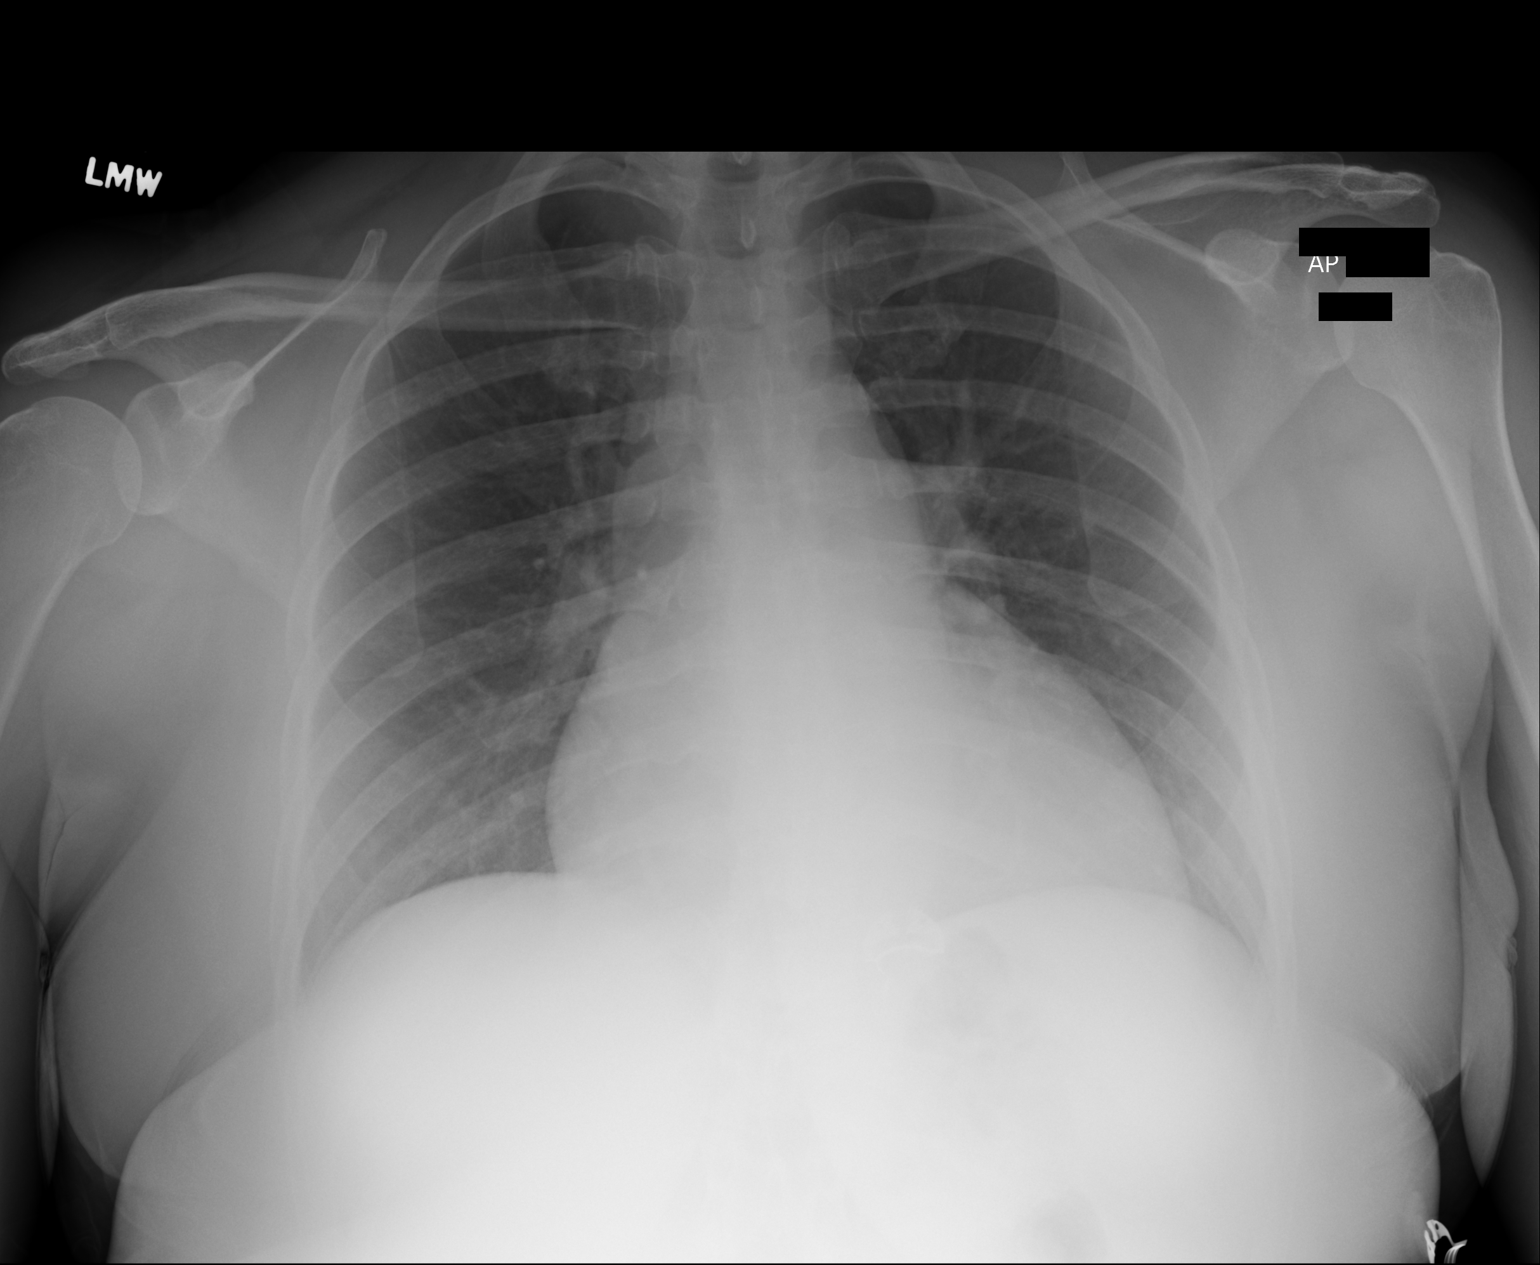

[1 of 1 positions shown; findings below may reference images not displayed]

FINDINGS: Slightly shallow inspiration. Cardiac enlargement without vascular
congestion. No focal airspace disease or consolidation in the lungs.
No blunting of costophrenic angles. No pneumothorax. Mediastinal
contours appear intact.
IMPRESSION: Mild cardiac enlargement.  No evidence of active pulmonary disease.

## 2017-07-07 IMAGING — XA IR ANGIO INTRA EXTRACRAN SEL COM CAROTID INNOMINATE BILAT MOD SE
1 series · 12 of 24 positions shown · IV contrast (IODINE)
Comparison: none

ADDENDUM:
Moderate (conscious) sedation was employed during this procedure. A
total of Versed 1.5 mg and Fentanyl 62.5 mcg was administered
intravenously.

Moderate Sedation Time: 42 minutes. The patient's level of
consciousness and vital signs were monitored continuously by
radiology nursing throughout the procedure under my direct
supervision.
CLINICAL DATA: Left-sided hemiparesis. Abnormal MRA of the brain.
Right cerebral hemisphere ischemic stroke.

[Series 300: dr. (person_name) · 12 of 160 slices shown]
[im 7/160]
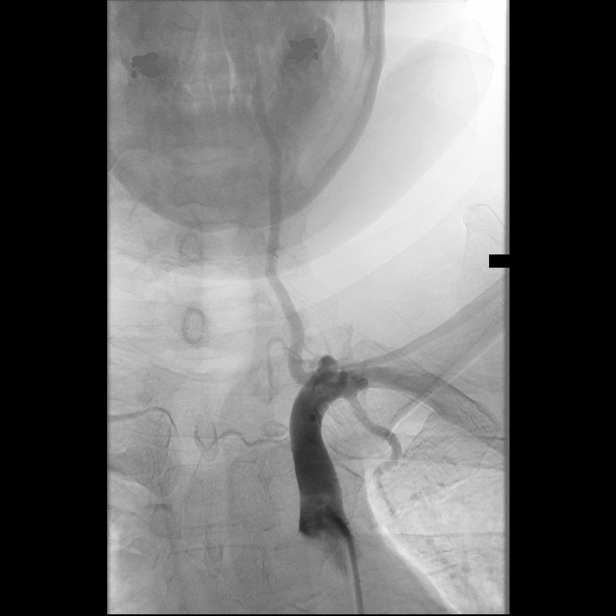
[im 21/160]
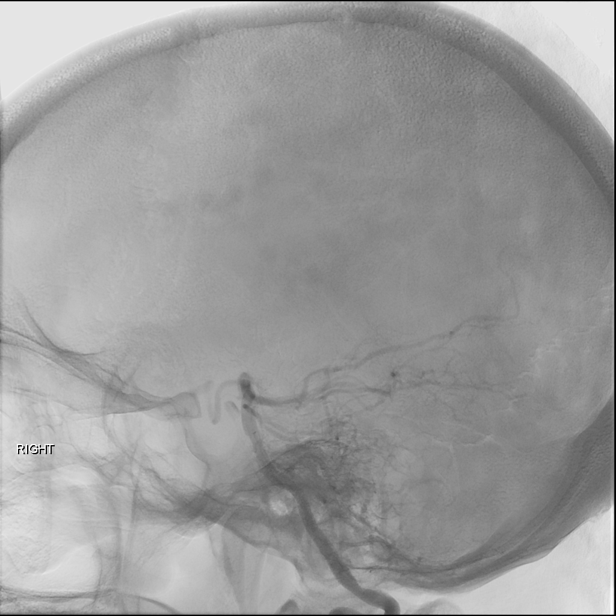
[im 35/160]
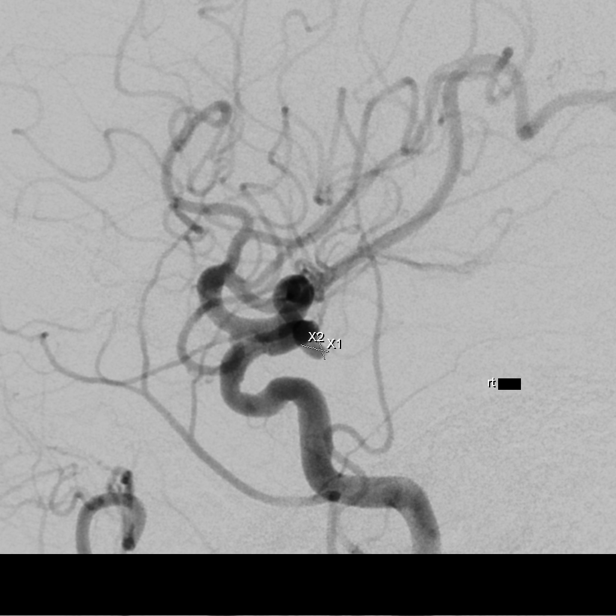
[im 49/160]
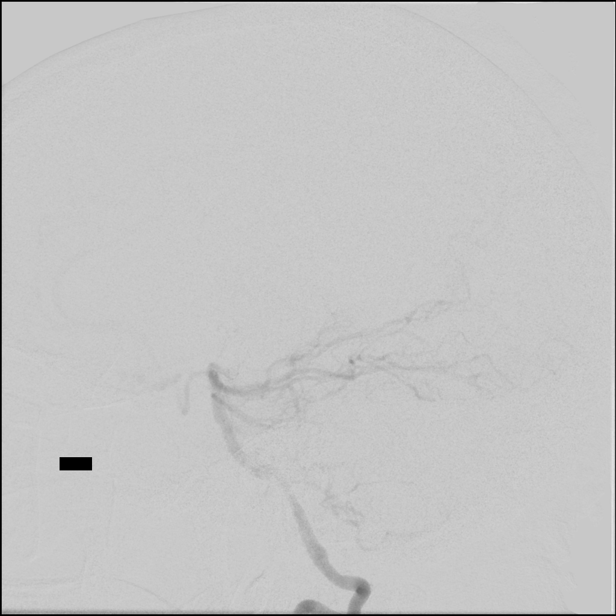
[im 63/160]
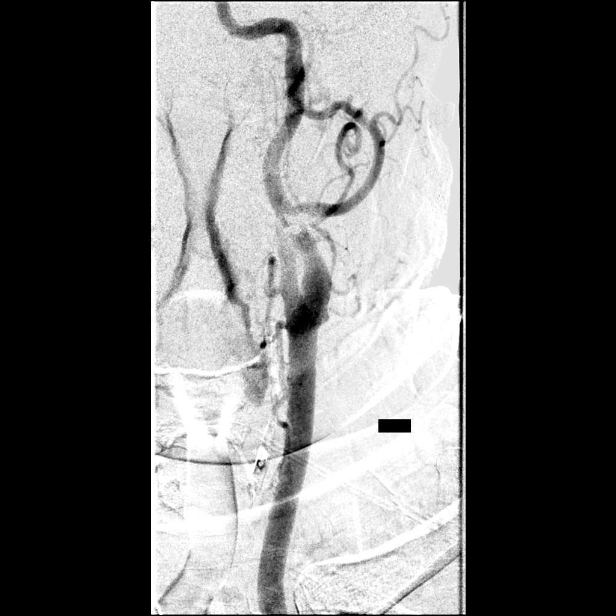
[im 77/160]
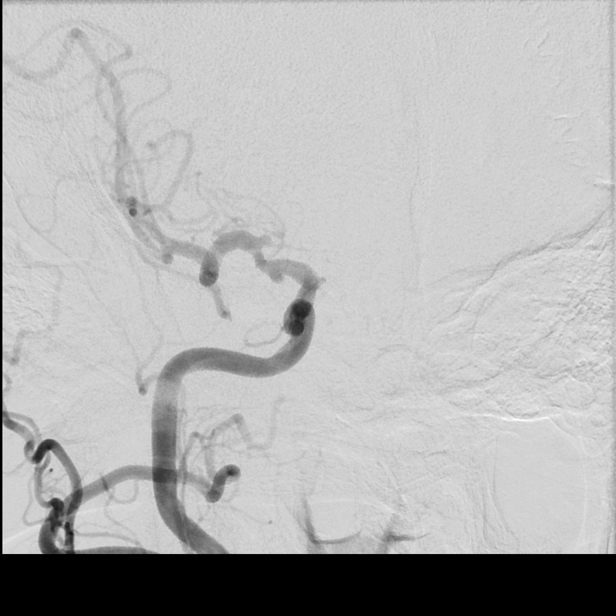
[im 90/160]
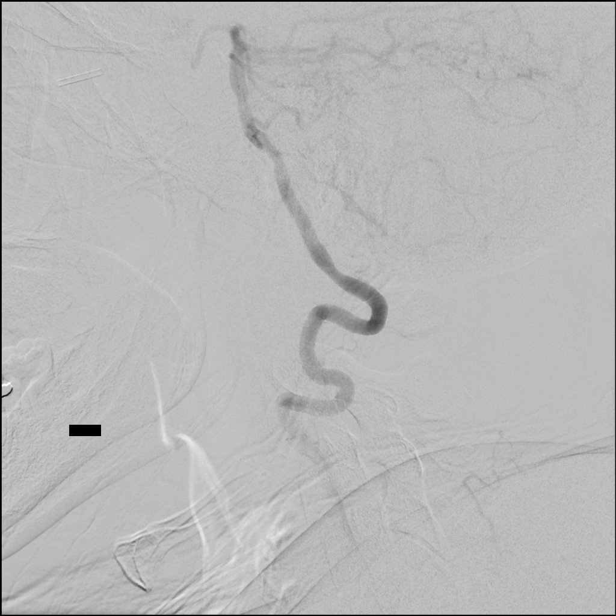
[im 104/160]
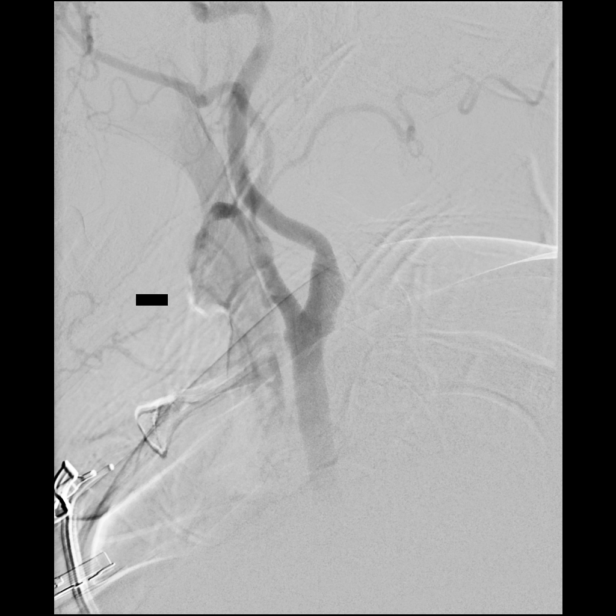
[im 118/160]
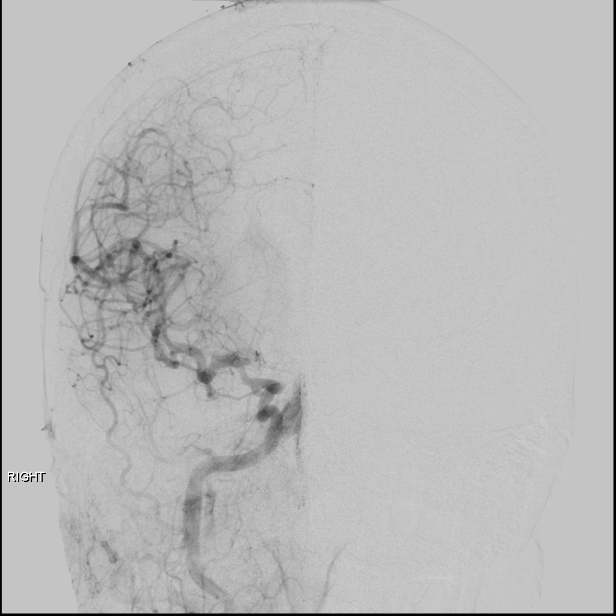
[im 132/160]
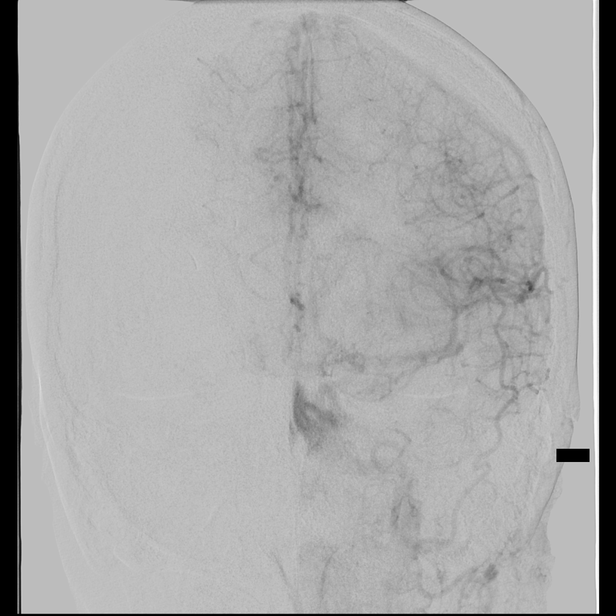
[im 146/160]
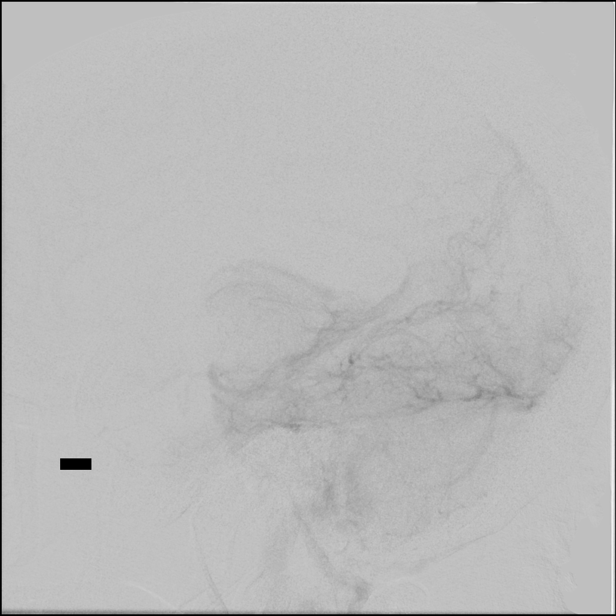
[im 160/160]
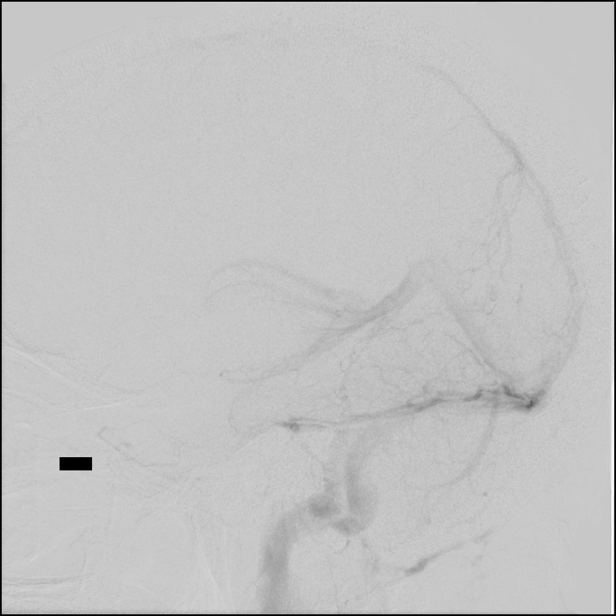

[12 of 24 positions shown; findings below may reference images not displayed]

EXAM:
BILATERAL COMMON CAROTID AND INNOMINATE ANGIOGRAPHY AND BILATERAL
VERTEBRAL ARTERY ANGIOGRAMS:

PROCEDURE:
Contrast: 60mL OMNIPAQUE IOHEXOL 300 MG/ML SOLN, 20mL OMNIPAQUE
IOHEXOL 300 MG/ML SOLN

Anesthesia/Sedation:  Conscious sedation.

Medications: Versed 1.5 mg IV.  Fentanyl 62.5 mcg IV.

Following a full explanation of the procedure along with the
potential associated complications, an informed witnessed consent
was obtained.

The right groin was prepped and draped in the usual sterile fashion.
Thereafter using modified Seldinger technique, transfemoral access
into the right common femoral artery was obtained without
difficulty. Over a 0.035 inch guidewire, a 5 French Pinnacle sheath
was inserted. Through this, and also over 0.035 inch guidewire, a 5
French JB1 catheter was advanced to the aortic arch region and
selectively positioned in the right common carotid artery, the right
vertebral artery, the left common carotid artery and the left
vertebral artery.

There were no acute complications. The patient tolerated the
procedure well.
FINDINGS: The origin of the right vertebral artery is normal.

The vessel is seen to opacify normally to the cranial skull base.

Normal opacification is seen of the right vertebrobasilar junction
and a hypoplastic right posterior inferior cerebellar artery.

The basilar artery, the posterior cerebral arteries, superior
cerebellar arteries and the anterior-inferior cerebellar arteries
are seen to opacify normally into the capillary and the venous
phases.

Prompt opacification of the right anterior circulation is seen via
the ipsilateral right posterior communicating artery.

Opacification of the anterior cerebral arteries is seen.

Non-opacified blood is seen in the basilar artery from the
contralateral vertebral artery.

The right common carotid arteriogram demonstrates the right external
carotid artery and its major branches to be widely patent.

The right internal carotid artery at the bulb to the cranial skull
base opacifies normally.

There is mild caliber irregularity of the caval cavernous segment of
the right internal carotid artery probably related to
arteriosclerosis.

An outpouching is noted projecting posteriorly and inferiorly from
the right posterior communicating artery region. This measures
approximately 3.5 x 3.3 mm and probably represents an intracranial
aneurysm.

Distal to this there is a segmental narrowing of approximately 70%
of the supraclinoid right ICA. The right middle cerebral artery
proximally in its distal distribution is seen to opacify into the
capillary and the venous phases.

There is truncation of the right anterior cerebral artery A1 segment
just distal to its origin.

The origin of the right vertebral artery is widely patent.

There is mild tortuosity just distal to its origin.

The vessel is otherwise seen to opacify normally to the cranial
skull base.

There is approximately 50% narrowing of the left vertebrobasilar
junction just proximal to its entry into the basilar artery.

Bilateral anterior inferior cerebellar artery/posterior inferior
cerebellar artery complexes are noted.

The opacified portions of the basilar artery, the posterior cerebral
arteries, superior cerebellar arteries and the anterior-inferior
cerebellar arteries demonstrate opacification into the capillary and
the venous phases.

Again demonstrated is prompt opacification of the right posterior
communicating artery and subsequently the supraclinoid right ICA
with faint opacification of the anterior circulation.

The left common carotid arteriogram demonstrates the left external
carotid artery and its major branches to be widely patent.

The left internal carotid artery at the bulb to the cranial skull
base opacifies normally.

The petrous, the cavernous and the supraclinoid segments are widely
patent.

A small infundibulum is noted in the region of the left posterior
communicating artery.

The left middle cerebral artery has a mild stenosis in its mid M1
segment.

Similar narrowing is noted in the left anterior cerebral artery at
its origin.

Distally there is opacification of the left middle cerebral artery
and the left anterior cerebral artery distributions with scattered
areas of mild caliber irregularity involving both the anterior
cerebral and middle cerebral artery distributions most likely
representing intracranial arteriosclerosis.

Prompt opacification of the right anterior cerebral artery A2
segment is noted via the anterior communicating artery.
IMPRESSION: Approximately 70-75% stenosis of the right internal carotid artery
supraclinoid segment.

Approximately 3.5 x 3.3 mm right internal carotid artery posterior
communicating artery region aneurysm.

Angiographically occluded right anterior cerebral artery just distal
to its origin.

Approximately 50% narrowing of the left vertebrobasilar junction.

Focal areas of mild caliber irregularity and narrowing in the
anterior circulation left more than right though non specific,
suggestive of intracranial arteriosclerosis, than vasculitis.

## 2017-08-06 ENCOUNTER — Ambulatory Visit
Admission: RE | Admit: 2017-08-06 | Discharge: 2017-08-06 | Disposition: A | Discharge status: Home / Self Care | Payer: BC Managed Care – PPO | Source: Ambulatory Visit | Attending: Family Medicine | Admitting: Family Medicine

## 2017-08-06 DIAGNOSIS — N6489 Other specified disorders of breast: Secondary | ICD-10-CM

## 2017-08-06 DIAGNOSIS — R928 Other abnormal and inconclusive findings on diagnostic imaging of breast: Secondary | ICD-10-CM | POA: Diagnosis present

## 2017-10-08 ENCOUNTER — Telehealth (HOSPITAL_COMMUNITY): Payer: Self-pay

## 2017-10-08 NOTE — Telephone Encounter (Signed)
Called to schedule f/u mri/mra, no answer, left message for pt to return call. AW

## 2017-10-09 ENCOUNTER — Other Ambulatory Visit (HOSPITAL_COMMUNITY): Payer: Self-pay | Admitting: Interventional Radiology

## 2017-10-09 DIAGNOSIS — I771 Stricture of artery: Secondary | ICD-10-CM

## 2017-10-24 ENCOUNTER — Ambulatory Visit (HOSPITAL_COMMUNITY)
Admission: RE | Admit: 2017-10-24 | Discharge: 2017-10-24 | Disposition: A | Discharge status: Home / Self Care | Payer: BC Managed Care – PPO | Source: Ambulatory Visit | Attending: Interventional Radiology | Admitting: Interventional Radiology

## 2017-10-24 ENCOUNTER — Ambulatory Visit (HOSPITAL_COMMUNITY): Payer: BC Managed Care – PPO

## 2017-10-24 DIAGNOSIS — I771 Stricture of artery: Secondary | ICD-10-CM | POA: Insufficient documentation

## 2017-10-24 DIAGNOSIS — I671 Cerebral aneurysm, nonruptured: Secondary | ICD-10-CM | POA: Insufficient documentation

## 2017-10-24 LAB — CREATININE, SERUM
CREATININE: 1.04 mg/dL — AB (ref 0.44–1.00)
GFR calc Af Amer: >60 mL/min (ref >60)

## 2017-10-24 MED ORDER — GADOBENATE DIMEGLUMINE 529 MG/ML IV SOLN
20.0000 mL | Freq: Once | INTRAVENOUS | Status: AC
  Administered 2017-10-24: 20 mL via INTRAVENOUS

## 2017-11-20 ENCOUNTER — Telehealth (HOSPITAL_COMMUNITY): Payer: Self-pay

## 2017-11-20 NOTE — Telephone Encounter (Signed)
Pt agreed to f/u in 6 months with mra only. She will call if she starts to have any weakness, numbness, or tingling per Dr. Corliss Skainseveshwar. AW

## 2017-11-20 NOTE — Telephone Encounter (Signed)
Called regarding pt's recent mri, left message for pt to return call. AW 

## 2017-11-21 ENCOUNTER — Other Ambulatory Visit: Payer: Self-pay | Admitting: Family Medicine

## 2017-11-21 DIAGNOSIS — R928 Other abnormal and inconclusive findings on diagnostic imaging of breast: Secondary | ICD-10-CM

## 2019-03-12 ENCOUNTER — Other Ambulatory Visit: Payer: Self-pay | Admitting: Family Medicine

## 2019-03-12 DIAGNOSIS — Z20822 Contact with and (suspected) exposure to covid-19: Secondary | ICD-10-CM

## 2019-03-17 LAB — NOVEL CORONAVIRUS, NAA: SARS-CoV-2, NAA: DETECTED — AB

## 2023-04-03 ENCOUNTER — Ambulatory Visit: Payer: BC Managed Care – PPO

## 2023-04-03 DIAGNOSIS — K5730 Diverticulosis of large intestine without perforation or abscess without bleeding: Secondary | ICD-10-CM | POA: Diagnosis not present

## 2023-04-03 DIAGNOSIS — K621 Rectal polyp: Secondary | ICD-10-CM

## 2023-04-03 DIAGNOSIS — Z1211 Encounter for screening for malignant neoplasm of colon: Secondary | ICD-10-CM

## 2023-12-04 ENCOUNTER — Other Ambulatory Visit: Payer: Self-pay | Admitting: Family Medicine

## 2023-12-04 DIAGNOSIS — Z1231 Encounter for screening mammogram for malignant neoplasm of breast: Secondary | ICD-10-CM

## 2023-12-17 ENCOUNTER — Ambulatory Visit
Admission: RE | Admit: 2023-12-17 | Discharge: 2023-12-17 | Disposition: A | Discharge status: Home / Self Care | Payer: Self-pay | Source: Ambulatory Visit | Attending: Family Medicine | Admitting: Family Medicine

## 2023-12-17 DIAGNOSIS — Z1231 Encounter for screening mammogram for malignant neoplasm of breast: Secondary | ICD-10-CM | POA: Diagnosis present
